# Patient Record
Sex: Male | Born: 1960
Health system: Southern US, Community
[De-identification: ages and names within clinical notes are randomized; demographics above are authoritative.]

## PROBLEM LIST (undated history)

## (undated) DIAGNOSIS — E785 Hyperlipidemia, unspecified: Secondary | ICD-10-CM

## (undated) DIAGNOSIS — G43909 Migraine, unspecified, not intractable, without status migrainosus: Secondary | ICD-10-CM

## (undated) DIAGNOSIS — I471 Supraventricular tachycardia, unspecified: Secondary | ICD-10-CM

## (undated) DIAGNOSIS — G8929 Other chronic pain: Secondary | ICD-10-CM

## (undated) DIAGNOSIS — M549 Dorsalgia, unspecified: Secondary | ICD-10-CM

## (undated) DIAGNOSIS — I2510 Atherosclerotic heart disease of native coronary artery without angina pectoris: Secondary | ICD-10-CM

## (undated) DIAGNOSIS — I10 Essential (primary) hypertension: Secondary | ICD-10-CM

## (undated) DIAGNOSIS — R7303 Prediabetes: Secondary | ICD-10-CM

## (undated) HISTORY — DX: Supraventricular tachycardia, unspecified: I47.10

## (undated) HISTORY — PX: CORONARY STENT PLACEMENT: SHX1402

## (undated) HISTORY — DX: Essential (primary) hypertension: I10

## (undated) HISTORY — DX: Prediabetes: R73.03

## (undated) HISTORY — DX: Supraventricular tachycardia: I47.1

## (undated) HISTORY — PX: LEG SURGERY: SHX1003

## (undated) HISTORY — DX: Migraine, unspecified, not intractable, without status migrainosus: G43.909

---

## 2002-05-31 ENCOUNTER — Encounter: Admission: RE | Admit: 2002-05-31 | Discharge: 2002-08-29 | Payer: Self-pay

## 2006-02-06 ENCOUNTER — Ambulatory Visit: Payer: Self-pay | Admitting: Cardiology

## 2006-02-10 ENCOUNTER — Ambulatory Visit: Payer: Self-pay | Admitting: Cardiology

## 2006-02-12 ENCOUNTER — Encounter: Payer: Self-pay | Admitting: Cardiology

## 2006-02-12 ENCOUNTER — Inpatient Hospital Stay (HOSPITAL_COMMUNITY): Admission: RE | Admit: 2006-02-12 | Discharge: 2006-02-13 | Payer: Self-pay | Admitting: Cardiovascular Disease

## 2006-02-12 ENCOUNTER — Inpatient Hospital Stay (HOSPITAL_BASED_OUTPATIENT_CLINIC_OR_DEPARTMENT_OTHER): Admission: RE | Admit: 2006-02-12 | Discharge: 2006-02-12 | Payer: Self-pay | Admitting: Cardiovascular Disease

## 2006-02-12 ENCOUNTER — Ambulatory Visit: Payer: Self-pay | Admitting: Cardiovascular Disease

## 2006-02-24 HISTORY — PX: CARDIAC CATHETERIZATION: SHX172

## 2006-03-11 ENCOUNTER — Ambulatory Visit: Payer: Self-pay | Admitting: Cardiology

## 2006-03-20 ENCOUNTER — Encounter: Payer: Self-pay | Admitting: Cardiology

## 2006-03-25 ENCOUNTER — Ambulatory Visit: Payer: Self-pay | Admitting: Cardiology

## 2006-03-25 ENCOUNTER — Observation Stay (HOSPITAL_COMMUNITY): Admission: EM | Admit: 2006-03-25 | Discharge: 2006-03-27 | Payer: Self-pay | Admitting: Emergency Medicine

## 2006-03-26 ENCOUNTER — Encounter: Payer: Self-pay | Admitting: Cardiology

## 2006-04-02 ENCOUNTER — Ambulatory Visit: Payer: Self-pay | Admitting: Gastroenterology

## 2006-04-22 ENCOUNTER — Ambulatory Visit: Payer: Self-pay | Admitting: Cardiology

## 2006-05-21 ENCOUNTER — Encounter: Payer: Self-pay | Admitting: Cardiology

## 2006-11-06 ENCOUNTER — Ambulatory Visit: Payer: Self-pay | Admitting: Cardiovascular Disease

## 2006-11-06 ENCOUNTER — Ambulatory Visit: Payer: Self-pay | Admitting: Cardiology

## 2006-11-06 LAB — CONVERTED CEMR LAB
BUN: 15 mg/dL (ref 6–23)
Basophils Absolute: 0 10*3/uL (ref 0.0–0.1)
Calcium: 9.3 mg/dL (ref 8.4–10.5)
Creatinine, Ser: 1.2 mg/dL (ref 0.4–1.5)
Eosinophils Absolute: 0.3 10*3/uL (ref 0.0–0.6)
Eosinophils Relative: 3.8 % (ref 0.0–5.0)
GFR calc Af Amer: 84 mL/min
GFR calc non Af Amer: 69 mL/min
INR: 0.9 (ref 0.8–1.0)
Lymphocytes Relative: 28.4 % (ref 12.0–46.0)
Potassium: 4.1 meq/L (ref 3.5–5.1)
RBC: 5.05 M/uL (ref 4.22–5.81)
Sodium: 141 meq/L (ref 135–145)
aPTT: 28.9 s (ref 21.7–29.8)

## 2006-11-13 ENCOUNTER — Inpatient Hospital Stay (HOSPITAL_BASED_OUTPATIENT_CLINIC_OR_DEPARTMENT_OTHER): Admission: RE | Admit: 2006-11-13 | Discharge: 2006-11-13 | Payer: Self-pay | Admitting: Cardiovascular Disease

## 2006-11-13 ENCOUNTER — Ambulatory Visit: Payer: Self-pay | Admitting: Internal Medicine

## 2009-07-19 ENCOUNTER — Telehealth: Payer: Self-pay | Admitting: Cardiology

## 2009-08-03 ENCOUNTER — Encounter: Admission: RE | Admit: 2009-08-03 | Discharge: 2009-08-03 | Payer: Self-pay | Admitting: Family Medicine

## 2009-08-21 ENCOUNTER — Encounter: Admission: RE | Admit: 2009-08-21 | Discharge: 2009-08-21 | Payer: Self-pay | Admitting: Family Medicine

## 2009-09-14 ENCOUNTER — Encounter: Admission: RE | Admit: 2009-09-14 | Discharge: 2009-09-14 | Payer: Self-pay | Admitting: Family Medicine

## 2010-03-26 NOTE — Progress Notes (Signed)
Summary: PHONE: ANTI INFLAMMATORY MEDICATIONS  Phone Note Call from Patient Call back at Home Phone [phone]   Caller: Evelina Bucy -[phone]- cell # [phone] Reason for Call: Talk to Doctor Summary of Call: Micah Flesher to see PA @ [facility] and they are wanting to put him on anti inflammatory medications due to alot of pain in back. Was told to contact Dr. Andee Lineman before taking this medication.  Initial call taken by: Zachary George,  Jul 19, 2009 3:44 PM  Follow-up for Phone Call        in the absence of a medication list and not having seen the patient in 2008, I cannot answer this question appropriately. Follow-up by: Lewayne Bunting, MD, [facility],  Jul 22, 2009 8:26 PM  Additional Follow-up for Phone Call Additional follow up Details #1::        Wife notified.   Hoover Brunette, LPN  Jul 24, 2009 3:07 PM

## 2010-03-26 NOTE — Letter (Signed)
Summary: Discharge Summary/ [facility]  Discharge [facility]   Imported By: Dorise Hiss 07/31/2009 15:31:54  _____________________________________________________________________  External Attachment:    Type:   Image     Comment:   External Document

## 2010-03-26 NOTE — Cardiovascular Report (Signed)
Summary: Cardiac Catheterization  Cardiac Catheterization   Imported By: Dorise Hiss 07/31/2009 16:52:33  _____________________________________________________________________  External Attachment:    Type:   Image     Comment:   External Document

## 2010-03-26 NOTE — Cardiovascular Report (Signed)
Summary: Cardiac Catheterization  Cardiac Catheterization   Imported By: Dorise Hiss 07/31/2009 16:57:17  _____________________________________________________________________  External Attachment:    Type:   Image     Comment:   External Document

## 2010-07-09 NOTE — Cardiovascular Report (Signed)
NAMECAMERIN, [name]              ACCOUNT NO.:  [id number]   MEDICAL RECORD NO.:  [id number]          PATIENT TYPE:  OIB   LOCATION:  1961                         FACILITY:  [facility]   PHYSICIAN:  Bevelyn Buckles. Bensimhon, MDDATE OF BIRTH:  [birth year]   DATE OF PROCEDURE:  11/13/2006  DATE OF DISCHARGE:                            CARDIAC CATHETERIZATION   PRIMARY CARE PHYSICIAN:  Dr. Donzetta Sprung   CARDIOLOGIST:  Dr. Andee Lineman   PATIENT IDENTIFICATION:  [name] is a very pleasant 50 year old male  with a history of coronary artery disease.  He is status post previous  Taxus stent placement to the mid right coronary artery as part of the  PERSEUS trial in December 2007.  He presents today for followup  catheterization as part of the trial.  He denies any recent chest pain  or exertional dyspnea.   PROCEDURES PERFORMED:  1. Selective coronary angiography.  2. Left heart catheterization.  3. Left ventriculogram.   DESCRIPTION OF PROCEDURE:  The risks and indications of the  catheterization were described.  Consent was signed and placed on the  chart.  A 4-French arterial sheath was placed in the right femoral  artery using a modified Seldinger technique.  Standard catheters  including a JL-4, a JR-4, and angled pigtail were used for the  catheterization.  All catheter exchanges made over a wire.  No apparent  complications.   Central aortic pressure was 97/53 with a mean of 74.  LV pressure was  96/1 with an EDP of 3.  There is no aortic stenosis.   Left main was angiographically normal.   LAD was a long vessel coursing to the apex.  It gave off five small  diagonals.  There was a 40% ostial stenosis followed by a 40% proximal  stenosis at the takeoff of the first diagonal.  The midportion of the  LAD had a moderate amount of calcification.   The left circumflex vessel gave off a tiny ramus branch, a moderate-  sized OM-1, a small OM-2, and two small posterolaterals.  There is a  30%  stenosis in the ostial left circumflex and a 40% stenosis in the ostium  of the first marginal.   Right coronary artery was a large dominant vessel.  It gave off a large  PDA and two posterolaterals.  There is a stent in the mid section.  This  was widely patent.  After the stent there was about a 30% focal  stenosis.   Left ventriculogram done in the RAO position showed ejection fraction  approximately 60% with no wall motion abnormalities.   ASSESSMENT:  1. Nonobstructive coronary artery disease as described above with      patent right coronary artery stent.  2. Normal left ventricular function.   PLAN/DISCUSSION:  His stent is widely patent.  We will continue  aggressive medical therapy.      Bevelyn Buckles. Bensimhon, MD  Electronically Signed     DRB/MEDQ  D:  11/13/2006  T:  11/13/2006  Job:  [id number]   cc:   Noah Charon, MD,FACC

## 2010-07-09 NOTE — Assessment & Plan Note (Signed)
[facility]                            CARDIOLOGY OFFICE NOTE   NAME:Rames, JAYVEON CONVEY                 MRN:          [id number]  DATE:11/06/2006                            DOB:          14-Dec-1960    Mr. Marschall is in today for pre-cath evaluation.  The patient is in the  [employer] trial.  He presented originally in December of  2007, and underwent placement of a Taxus stent in the right coronary  artery by Dr. Juanda Chance.  He was found to have nonobstructive disease in  the left coronary tree.  The patient then had followup with Dr. Andee Lineman,  but had recurrent chest pain with some exertion.  Following this, he  underwent repeat cardiac catheterization in January by Dr. Juanda Chance.  At  that time, there was 40-50% narrowing in the proximal LAD and mild  irregularities in the mid-LAD.  There was a 30% ostial stenosis in the  circumflex and a 40% narrowing in the first marginal.  The right  coronary artery was a moderate-sized vessel and gave rise to a conus  branch, two right ventricular branches, a posterior descending and a  posterolateral branch.  The stent in the mid right coronary artery was  widely patent with zero stenosis.  There were irregularities in the  proximal and distal right, but no significant obstruction.  The left  ventriculogram was performed in the RAO projection and revealed no areas  of hypokinesis with an ejection fraction estimated in the range of about  60%.  His cardiac pressures at that time were relatively normal.  Since  that time, the patient has done well.  He has not been having any  significant exertional angina.  He has had followup and has remained on  aspirin and Plavix.   PAST MEDICAL HISTORY:  Remarkable for gastroesophageal reflux,  hyperlipidemia and history of tobacco use, but he is no longer smoking.   CURRENT MEDICATIONS INCLUDE:  1. Plavix 75 mg daily.  2. Aspirin 325 mg daily.  3. Lipitor 40  mg daily.  4. Lopressor 25 mg b.i.d.  5. Prevacid 30 mg daily.   ALLERGIES:  The patient has no known drug allergies.   FAMILY/SOCIAL HISTORY:  As previously noted.   PHYSICAL EXAMINATION TODAY:  The patient is alert and oriented.  The weight is 199 pounds.  Blood pressure is 124/76 and the pulse is 76.  There is no jugular venous distention.  The carotid upstrokes are brisk  without bruit.  The lung fields are clear to auscultation and percussion.  The PMI is nondisplaced.  There is a normal first and second heart sound  without murmurs, rubs or gallops.  Abdomen is soft.  The extremities reveal no edema.  Pulses are intact.   There was no electrocardiogram performed today.   No chest x-ray was performed today, as well.   Preoperative laboratory studies include a hemoglobin of 15.1, hematocrit  of 44.6, the platelet count is normal at 286,000.  BUN is 15, creatinine  1.2 and potassium 4.1.   IMPRESSION:  1. Coronary artery disease with cardiac catheterization as described  with previously widely patent stent in the distribution of the      right coronary artery with the patient randomized in the Perseus      clinical trial.  2. Hypercholesterolemia, on lipid-lowering therapy.  3. Gastroesophageal reflux.  4. History of prior tobacco use.   PLAN:  The patient will undergo repeat diagnostic catheterization as  part of the randomized clinical trial.  Risks, benefits, and  alternatives have been explained to the patient and he consents to  proceed.   ADDENDUM:  The EKG is read as normal sinus rhythm and within normal  limits.     Arturo Morton. Riley Kill, MD, [facility]  Electronically Signed    TDS/MedQ  DD: 11/11/2006  DT: 11/11/2006  Job #: [id number]

## 2010-07-12 NOTE — Discharge Summary (Signed)
[name], Kyle              ACCOUNT NO.:  [id number]   MEDICAL RECORD NO.:  [id number]          PATIENT TYPE:  INP   LOCATION:  2022                         FACILITY:  [facility]   PHYSICIAN:  Kyle [name], ACNP  DATE OF BIRTH:  Oct 14, 1960   DATE OF ADMISSION:  03/25/2006  DATE OF DISCHARGE:  03/27/2006                               DISCHARGE SUMMARY   DISCHARGING PHYSICIAN:  Kyle [name].   PRIMARY CARDIOLOGIST:  Kyle [name].   PRIMARY CARE PHYSICIAN:  Kyle [name] in [place].   DISCHARGING DIAGNOSIS:  1. Chest pain of uncertain etiology, status post cardiac      catheterization this admission showing continued patency of      previous Taxus drug-eluting stent to the mid-RCA that was placed in      December 2007, otherwise nonobstructive CAD and normal left      ventricular function.  2. Gastroesophageal reflux disease.  3. Hyperlipidemia.  4. History of tobacco use; patient is currently not smoking.   CONSULTATIONS THIS ADMISSION:  Include Kyle [name] with GI for noncardiac  chest discomfort; however, symptoms felt to be atypical for GERD.  They  recommended continued daily Protonix or any PPI.  Follow up with Dr.  Christella [name] February 19.   PAST MEDICAL HISTORY:  Includes coronary artery disease, status post  cardiac catheterization December 2007 with right coronary artery stent  placement.   HOSPITAL COURSE:  Kyle [name] is a 50 year old Caucasian gentleman with  a history of coronary artery disease as stated above.  He saw Kyle [name]  in his office on January 16 of this year with some complaints of chest  discomfort that occurs on exertion with accelerated walking.  Patient  apparently is very active and either walks or jogs daily.  On the  morning of admission, patient experienced the chest tightness again with  exertion, apparently was relieved with burping, however, and then it  returned.  Patient presented to the emergency room for further  evaluation.   Apparently when he saw Kyle [name] in January, Kyle [name]  had placed the patient on Imdur.  Patient, however, states he is not  sure he has been taking it as his wife fills out his medicine tray for  him.  Patient was admitted, scheduled for cardiac catheterization for  reevaluation of his stent.  Patient taken to the cath lab on January 31,  results as stated above.  Patient tolerated cath without problems.  GI  was asked to consult.  Kyle [name] and Kyle [name] saw patient in  consultation with arrangements to see patient in office in an outpatient  setting.  At time of discharge, patient's hemoglobin and hematocrit  stable at 14.3 and 41.4.  Blood pressure 83/51, low grade temp 99, _____  stable.  Kyle [name] recommended continuing aspirin, Plavix and Statin.  Patient's discharge instruction sheet was filled out by Kyle [name]  with instructions to continue a low sodium heart healthy diet.  Wound  care activity per supplemental sheet.  Followup appointment with Dr.  Christella [name] February 19, Kyle [name] February 28 at 12:15 and Dr.  Julien [name],  primary care physician, patient to call for appointment.   MEDICATIONS AT TIME OF DISCHARGE:  Include Adalat 400 mg.  GI has asked  patient to stop using this daily, to use it p.r.n.  Aspirin 325, Lipitor  40, metoprolol 25 mg b.i.d., Plavix 75 daily and Imdur.  There is no  dose listed for that.  Patient had previously received a prescription  from Kyle [name] and has medication at home.  Nitroglycerin as needed,  Protonix 40 mg once a day for stomach and/or equivalent.  Can use over-  the-counter Prilosec, take 20 minutes before meals or three hours after  meals per Kyle Moccasin, PA, GI.  In further evaluation, Kyle [name]  note from January does not list the dose on the Imdur that he prescribed  patient.  Patient to follow up with Kyle [name] concerning Imdur use and  whether or not he needs to continue this medication.   DURATION OF DISCHARGE  ENCOUNTER:  25 minutes.      Kyle [name], ACNP     MB/MEDQ  D:  05/16/2006  T:  05/17/2006  Job:  [id number]   cc:   Kyle [name]  Kyle [name]

## 2010-07-12 NOTE — H&P (Signed)
Kyle [name], Kyle [name] NO.:  [id number]   MEDICAL RECORD NO.:  [id number]          PATIENT TYPE:  INP   LOCATION:  2022                         FACILITY:  [facility]   PHYSICIAN:  Luis Abed, MD, FACCDATE OF BIRTH:  Nov 11, 1960   DATE OF ADMISSION:  03/25/2006  DATE OF DISCHARGE:                              HISTORY & PHYSICAL   PRIMARY CARDIOLOGIST:  Dr. Lewayne Bunting   PRIMARY CARE PHYSICIAN:  Thersa Salt in [place]   HISTORY OF PRESENT ILLNESS:  This is a 50 year old Caucasian male  patient well known to Dr. Andee Lineman with recent admission in December of  2007 secondary to chest pain.  The patient subsequently had a cardiac  catheterization revealing a 99% right coronary artery stenosis.  A Taxus  stent was placed in December of 2007 per Dr. Tonny Bollman secondary to  this stenosis.  The patient also was found to have nonobstructive  coronary artery disease in the left coronary tree.   The patient did follow up with Dr. Andee Lineman and was seen in his office on  March 11, 2006.  The patient had been having some complaints of chest  discomfort which occurs on exertion and accelerated walking.  He also  reported pain in both lower extremities on exertion.  The patient jogged  yesterday approximately one lap around a football field and during that  time he developed sudden substernal sharp chest pain which he described  as severe.  The patient stopped running and walked an additional lap and  the pain went away.  The following morning this a.m. the patient felt  some chest tightness although less intense which he described as a dull  pain which comes and goes and relieved with burping, but then returns.  He has also noted increased tightness throughout the day while at work.  He feels a gas buildup, he burps and then it goes away and it returns  with increased pressure in his chest and burping again.  Secondary to  these symptoms, the patient came to the emergency room  for further  evaluation.   When being seen by Dr. Andee Lineman on March 11, 2006 and describing his  symptoms of chest discomfort with some exertion, Dr. Andee Lineman did place  him on Imdur at an unknown dose.  The patient is not certain whether he  has taken it, as his wife fills his medicine tray and fills his  prescriptions.  It is uncertain if he has had Imdur onboard at this  time.   PAST MEDICAL HISTORY:  1. Coronary artery disease status post cardiac catheterization      December of 2007 with right coronary artery stenosis 99%.  2. Patient had percutaneous coronary intervention and a Taxus stent to      the right coronary artery in December of 2007.  3. GERD.  4. Hyperlipidemia.   SOCIAL HISTORY:  The patient lives in [place] with his wife.  He is a  Pensions consultant for [employer].  He has a 15-pack-year history, but stopped  seven to eight years ago, negative for ETOH or drug use.  He  does walk  and run almost daily.   FAMILY HISTORY:  Epilepsy, AAA repair, father with coronary artery  disease, PCI and carotid artery disease, he has a son with bicuspid  aortic valve.   CURRENT MEDICATIONS AT HOME:  1. Elodolac 400 mg once a day.  2. Aspirin 325 once a day.  3. Lipitor 40 mg once day.  4. Metoprolol 25 mg b.i.d.  5. Plavix 75 mg once a day.  6. Imdur at unknown dose.   ALLERGIES:  No known drug allergies.   LABS:  Point-of-care markers:  Troponin 0.05, myoglobin 82.0, CK-MB 1.5,  hemoglobin 45, hematocrit 15.3, sodium 139, potassium 3.8, chloride 108,  CO2 unknown, BUN 13, creatinine 0.9, glucose 86, PT 13.9, INR 1.1, PTT  29.   EKG revealing normal sinus rhythm with a ventricular rate of 64 beats  per minute without evidence of ischemia.   PHYSICAL EXAMINATION:  VITAL SIGNS:  Blood pressure 110/73, pulse 70,  respirations 20, O2 sat 97.9.  HEENT:  Head is normocephalic, atraumatic.  Eyes:  PERRLA.  Mucous  membranes and mouth pink and moist.  Tongue is midline.  NECK:   Supple.  There is no JVD.  No carotid bruits appreciated.  No  thyromegaly noted.  CARDIOVASCULAR:  Regular rate and rhythm without murmurs, rubs or  gallops.  LUNGS:  Clear to auscultation.  ABDOMEN:  Soft, nontender, 2+ bowel sounds.  EXTREMITIES:  Without clubbing, cyanosis or edema.  Dorsalis pedis  pulses 1+ bilaterally.  SKIN:  Warm and dry.  NEURO:  Intact.   IMPRESSION:  1. Chest pain in patient with known history of CAD and stent to the      right coronary artery in December of 2007.  2. Frequent burping symptoms post PCI.  3. Hypercholesterolemia.   PLAN:  This patient has been seen and examined by myself and Dr. Willa Rough in the emergency room, he has recurrent symptoms and has been seen  by Dr. Andee Lineman with addition of Imdur since PCI.  Plan will be to admit  the patient and we will schedule cardiac catheterization in the a.m. of  March 26, 2006.  If the cardiac catheterization  reveals patent stent and no additional evidence of coronary artery  disease, our plan will be to evaluate the patient through a GI workup.  This will be considered after catheterization.  This has been explained  to the patient including risks and benefits and the patient is willing  to proceed.      Bettey Mare. Lyman Bishop, NP      Luis Abed, MD, [facility]  Electronically Signed    KML/MEDQ  D:  03/25/2006  T:  03/26/2006  Job:  [id number]   cc:   Aurther Loft MD Julien Girt
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
## 2010-07-12 NOTE — Assessment & Plan Note (Signed)
[facility]                          [facility] OFFICE NOTE   NAME:Luna, DREY SHAFF                 MRN:          [id number]  DATE:04/22/2006                            DOB:          [birth year]    PRIMARY CARDIOLOGIST:  Dr. Lewayne Bunting.   REASON FOR VISIT:  Post-hospital followup.   Mr. Yaffe returns to the clinic following recent overnight stay at  [facility] for evaluation of recurrent chest pain.  He  underwent repeat coronary angiography after having undergone initial  percutaneous intervention of a very high-grade, focal mid RCA lesion  with placement of a TAXUS stent in mid December.  Repeat cardiac  catheterization by Dr. Charlies Constable this admission revealed a widely  patent RCA stent site with otherwise residual nonobstructive CAD and  normal left ventricular function.   The patient's symptoms were felt to be non-cardiac in etiology, per Dr.  Charlies Constable, and the patient was discharged on Protonix.  He now  presents in followup, reporting some amelioration of his symptoms since  taking Protonix.  He denies any exertional angina pectoris.   CURRENT MEDICATIONS:  1. Plavix.  2. Full-dose aspirin.  3. Lipitor 40 daily.  4. Metoprolol 25 b.i.d.  5. Protonix.  6. Etodolac 400 daily.   PHYSICAL EXAM:  Blood pressure 108/69, pulse 60 and regular, weight 205.  GENERAL:  A 50 year old male in no apparent distress.  HEENT:  Normocephalic, atraumatic.  NECK:  Palpable bilateral carotid pulses without bruits.  LUNGS:  Clear to auscultation in all fields.  HEART:  Regular rate and rhythm (S1, S2).  No significant murmurs.  No  rubs or gallops.  ABDOMEN:  Soft and nontender.  EXTREMITIES:  Right groin stable with no ecchymosis, hematoma, or bruit  on auscultation.  Palpable femoral and distal pulse.  NEURO:  Mildly anxious, but no focal deficits.   IMPRESSION:  1. Atypical chest pain.  2. Severe single-vessel coronary  artery disease.      a.     Status post TAXUS stenting subtotal mid right coronary       artery December 2007:  Widely patent by recent re-look coronary       angiogram.      b.     Residual nonobstructive coronary artery disease.      c.     Normal left ventricular function.      d.     Enrolled in PERSEUS:  Recommended re-look cardiac       catheterization in 9 months.  3. History of claudication.      a.     Recent normal lower extremity ankle brachial index.  4. Remote tobacco.  5. Dyslipidemia.      a.     Hypertriglyceridemia, low HDL.   PLAN:  1. Continue current medication regimen, including Plavix for      recommended duration of 2 years.  2. Check a followup fasting lipid/liver profile in 1 month.  My      recommendation at this point in time is to consider substituting      Lipitor with Crestor given his particular  lipid profile.  3. Return clinic followup with Dr. Andee Lineman in 6 months.      Gene Serpe, PA-C  Electronically Signed      Learta Codding, MD,FACC  Electronically Signed   GS/MedQ  DD: 04/22/2006  DT: 04/22/2006  Job #: [id number]   cc:   Donzetta Sprung

## 2010-07-16 NOTE — Discharge Summary (Signed)
Kyle [name], Kyle [name]              ACCOUNT NO.:  [id number]   MEDICAL RECORD NO.:  [id number]          PATIENT TYPE:  INP   LOCATION:  6532                         FACILITY:  [facility]   PHYSICIAN:  Veverly Fells. Excell Seltzer, MD  DATE OF BIRTH:  1960-04-24   DATE OF ADMISSION:  02/12/2006  DATE OF DISCHARGE:  02/13/2006                               DISCHARGE SUMMARY   PRIMARY CARDIOLOGIST:  Dr. Lewayne Bunting.   PRIMARY CARE PHYSICIAN:  Dr. Donzetta Sprung in [place].   PRINCIPAL DIAGNOSIS:  Unstable angina.   SECONDARY DIAGNOSES:  1. Coronary artery disease.  2. Gastroesophageal reflux disease.  3. History of tonsillectomy.  4. Remote tobacco abuse.  5. History of right ankle surgery.  6. Hyperlipidemia.   ALLERGIES:  NO KNOWN DRUG ALLERGIES.   PROCEDURES:  Left heart cardiac catheterization with PCI and stenting of  the right coronary artery with a Perseus study stent (Taxus versus Taxus  Element drug-eluting stent).   HISTORY OF PRESENT ILLNESS:  A 50 year old male with no prior history of  coronary artery disease and remote history of tobacco abuse who recently  saw Dr. Andee Lineman in the office on February 10, 2006 secondary to a 35-month  history of exertional chest discomfort with subsequent abnormal stress  Myoview revealing moderate inferior ischemia.  Decision was made to  pursue left heart cardiac catheterization at that time.   HOSPITAL COURSE:  The patient presented to [facility] February 12, 2006  for left heart cardiac catheterization which was performed by Dr.  Tonny Bollman revealing nonobstructive coronary disease in the left  coronary tree with a 99% stenosis in the mid RCA.  EF was 65%.  The RCA  was then approached percutaneously, it was successful, and the patient  was enrolled in the Perseus study and underwent successful placement of  a Perseus study stent (Taxus versus Taxus Element drug-eluting stent).  The patient tolerated this procedure well and postprocedure has not  had  any chest discomfort.  He has been monitored in the step-down area and  has been ambulating without difficulty.  He is being discharged home  today in satisfactory condition.   DISCHARGE LABORATORIES:  Hemoglobin 15.3, hematocrit 44.6, WBC 10.4,  platelets 284.  Sodium 137, potassium 3.9, chloride 103, CO2 26, BUN 15,  creatinine __________, glucose 99, CK-MB 1.8, CK 153.   DISPOSITION:  The patient is being discharged home today in good  condition.   FOLLOW-UP PLANS AND APPOINTMENTS:  Mr. Lazalde has a follow-up  appointment with Dr. Andee Lineman on March 11, 2006 at 2:15 p.m.  He is  asked to follow up with Dr. Donzetta Sprung as previously scheduled.  As  part of Perseus study protocol, the patient will require a re-look  cardiac catheterization 9 months postprocedure.   DISCHARGE MEDICATIONS:  1. Aspirin 325 mg daily.  2. Plavix 75 mg daily.  3. Lipitor 40 mg daily.  4. Lopressor 25 mg b.i.d.  5. Nitroglycerin 0.4 mg sublingual p.r.n. chest pain.   OUTSTANDING LABORATORY STUDIES:  None.   DURATION DISCHARGE ENCOUNTER:  Forty minutes including physician time.  Kyle [name], Kyle [name]      Veverly Fells. Excell Seltzer, MD  Electronically Signed    CB/MEDQ  D:  02/13/2006  T:  02/13/2006  Job:  [id number]   cc:   Donzetta Sprung

## 2010-07-16 NOTE — Assessment & Plan Note (Signed)
[facility]                          [facility]ICE NOTE   NAME:Kyle [name], Kyle [name]                       MRN:          [id number]  DATE:02/10/2006                            DOB:          [birth year]    HISTORY OF PRESENT ILLNESS:  The patient is a 50 year old male with no  prior history of coronary artery disease.  The patient has a prior  history of tobacco use but quit approximately 10 years ago.  He used to  smoke a half pack to one pack a day.  Per his report, his cholesterol  has also been within normal limits, although I do not have the data  available.  The patient does have a family history of coronary artery  disease.  His father recently underwent PCI and is seen in our clinic.  He also has carotid disease.  The patient has a son with congenital  disease which includes bicuspid aortic valve.   The patient reports over the last 6 months a consistent history of  exertional chest pain.  He states that on occasion he jogs from his home  to his parents house.  In the time frame, he has noticed that he  develops a chest tightness which he usually rates a 4/10.  This is  associated with increased shortness of breath and some diaphoresis.  He  has noticed that stopping and resting fairly quickly resolves his chest  pressure.  He also has noticed that during heavy exertional physical  activity at the job, he develops a similar type of pain.  The patient  discussed these symptoms with Dr. Reuel Boom and a cardiology stress study  was ordered.  The study demonstrated that the patient had good exercise  tolerance but did report 2/10 chest pain at stage III of the Bruce  protocol.  This was relieved within 1 minute.  He did achieve a work  load of 12.4 mets.  The Cardiolite images demonstrate an ejection  fraction of 64% with a moderate inferior defect which was reversible  consistent with ischemia.   The patient now presents to the office to further  discuss the results of  his test.  He states that he has been very anxious after he found out  about this study.  He felt a pounding in his chest and has noticed some  palpitations in the evening.  He also reports he had left sided chest  pain which is distinctly different from his exertional chest tightness  as reported above.  His EKG, today in the office, does not show any  evidence of ischemia with no acute ischemic changes.   PAST MEDICAL HISTORY:  1. History of GERD.  2. Tonsillectomy.  3. Right ankle surgery.   SOCIAL HISTORY:  The patient works as Pensions consultant.  He used to smoke.  He  lives with his wife in [place].   FAMILY HISTORY:  Notable for a father who underwent percutaneous  coronary intervention, also has carotid disease, and has a son with  bicuspid aortic valve.  His son receives his medical care at [facility].  MEDICATIONS:  Etodolac 400 mg a day.   REVIEW OF SYSTEMS:  As per HPI.  The patient reports insomnia and  fatigue as well as weakness.  He also reports palpitations and  exertional chest pain as well as dyspnea as outlined above.  He reports  joint stiffness but no dysuria or frequency and no current heartburn.  No pruritus or hives.  No tremor.  According to his wife, this patient  has been different the last month.  No bruising and no melena,  hematochezia, no dysuria or frequency.  The remainder of the review of  systems is negative.   PHYSICAL EXAMINATION:  VITAL SIGNS:  Blood pressure 116/78, heart rate  is 85 beats per minute.  GENERAL:  A well nourished white male in no apparent distress.  HEENT:  Pupils equal and reactive.  Conjunctivae clear.  NECK:  Supple.  Normal carotid upstrokes.  No carotid bruits.  LUNGS:  Clear breath sounds bilaterally.  HEART:  Regular rate and rhythm.  Normal S1 S2.  No murmurs, rubs, or  gallops.  ABDOMEN:  Soft and nontender.  No rebound or guarding.  Good bowel  sounds.  EXTREMITIES:  Reveals no cyanosis,  clubbing, or edema.  NEUROLOGIC:  The patient is alert and oriented and grossly nonfocal.   A 12-lead electrocardiogram was described above.   PROBLEM LIST:  1. Exertional chest pain consistent with angina.  2. Abnormal Cardiolite study with inferior ischemia.  3. History of tobacco use previously.  4. Family history of abdominal aneurysm in grandmother.  5. Family history of coronary disease in father.   PLAN:  1. The patient's symptoms are very consistent with exertional angina.      He also has an abnormal Cardiolite stress study.  I have given the      patient the option to proceed with cardiac catheterization.  After      a long discussion with he and his wife, they feel comfortable to      proceed with the procedure in the [facility].  2. In the interim, I have placed the patient on aspirin and beta-      blocker therapy and p.r.n. nitroglycerin.  I told the patient that      he has substernal chest pain in similar quality to his exertional      pain at rest, he will need to present himself to the emergency room      if not resolved after one nitroglycerin.  3. The patient has been scheduled for a [facility] catheterization later this      week.     Learta Codding, MD,FACC  Electronically Signed    GED/MedQ  DD: 02/10/2006  DT: 02/10/2006  Job #: [id number]   cc:   Donzetta Sprung

## 2010-07-16 NOTE — Cardiovascular Report (Signed)
NAMEARDIAN, HABER[place]              ACCOUNT NO.:  [id number]   MEDICAL RECORD NO.:  [id number]          PATIENT TYPE:  OIB   LOCATION:  1961                         FACILITY:  [facility]   PHYSICIAN:  Veverly Fells. Excell Seltzer, MD  DATE OF BIRTH:  April 20, 1960   DATE OF PROCEDURE:  02/12/2006  DATE OF DISCHARGE:  02/12/2006                            CARDIAC CATHETERIZATION   PROCEDURE:  Left heart catheterization, selective coronary angiography,  left ventricular angiography.   INDICATIONS:  Mr. Isabell is a very pleasant 50 year old gentleman with  classic exertional angina over the last six months.  He underwent an  exercise Myoview stress study that demonstrated a moderate-sized  inferior wall defect which was reversible.  Today, he reports that since  his stress study, he has had increasing symptoms and occasionally has  had chest pain at rest.  He was referred for cardiac catheterization  following his abnormal stress test.   PROCEDURAL DETAILS:  Risks and indications of the procedure were  explained in detail to the patient.  Informed consent was obtained.  The  right groin was prepped, draped and anesthetized with 1% lidocaine.  A 4-  French sheath was placed in the right femoral artery using the modified  Seldinger technique.  Multiple views of the left and right coronary  arteries were taken.  For the left coronary artery, a 4-French JL-4  catheter was used for the right coronary artery a 4-French JR-4 catheter  was used.  Following selective coronary angiography, a 4-French angled  pigtail catheter was inserted into the left ventricle and ventricular  pressures were recorded.  A 30 degrees right anterior oblique left  ventriculogram was performed.  A pullback across the aortic valve was  done.  At the conclusion of the case, the arterial sheath was left in  place in anticipation of PCI later today.   FINDINGS:  Aortic pressure 101/63 with a mean of 82, left ventricular  pressure 114/0  with an end-diastolic pressure of 5.   CORONARY ANGIOGRAPHY:  The left mainstem is mildly calcified.  There is  no significant obstructive disease.  The left main stem bifurcates into  the LAD and left circumflex.   The LAD has nonobstructive disease throughout its proximal and  midportion.  The most significant area was the takeoff of the first  diagonal and there appears to be an approximate 40% stenosis through  that region.  The remainder of the mid and distal LAD have no  significant angiographic disease.  There were several small diagonals  arising from the mid and distal LAD.   Left circumflex system is medium caliber.  It gives off a medium-sized  first obtuse marginal branch that bifurcates into twin vessels.  There  is nonobstructive plaque in the proximal portion of that vessel.  There  is no other significant angiographic disease throughout the left  circumflex system.   Right coronary artery is dominant.  It is a large-caliber vessel.  There  is 20% proximal stenosis and in the midportion of the right coronary  artery.  There is a 95-99% very high-grade stenosis that is a focal  lesion.  There is TIMI III flow in the vessel.  The remainder of the  distal site remainder of the distal right coronary artery is free of any  significant angiographic disease.  The right coronary artery bifurcates  into the PDA and posterior AV segment which gives off two posterolateral  branch.  The high-grade lesion described above is also somewhat hazy in  appearance.  The branch vessels of the right coronary artery had no  significant disease.   Left ventricular function is normal as assessed by left ventriculogram.  The LVEDF is 65%.   ASSESSMENT:  1. Severe single-vessel coronary artery disease.  2. Nonobstructive left anterior descending artery in the left      circumflex disease.  3. Normal left ventricular function.   PLAN:  I reviewed the findings in detail with the patient.  In  the  setting of his increasing symptoms and intermittent chest pain, I  elected to admit him to the hospital perform PCI today.  He was given  600 mg of Plavix.  We will plan on using bivalirudin for  anticoagulation.      Veverly Fells. Excell Seltzer, MD  Electronically Signed     MDC/MEDQ  D:  02/12/2006  T:  02/13/2006  Job:  [id number]   cc:   Learta Codding, MD,FACC

## 2010-07-16 NOTE — Cardiovascular Report (Signed)
Kyle [name], Kyle [name]              ACCOUNT NO.:  [id number]   MEDICAL RECORD NO.:  [id number]          PATIENT TYPE:  INP   LOCATION:  2022                         FACILITY:  [facility]   PHYSICIAN:  Bruce R. Juanda Chance, MD, FACCDATE OF BIRTH:  06-28-1960   DATE OF PROCEDURE:  03/26/2006  DATE OF DISCHARGE:                            CARDIAC CATHETERIZATION   CLINICAL HISTORY:  Mr. [name] is 50 years old and one month ago had a  Taxus stent placed in the mid right coronary artery as part of the  Perseus trial.  He developed recurrent chest pain today while jogging.  He saw Dr. Andee Lineman and Dr. Andee Lineman arranged for him to come down to [facility] for further evaluation with angiography.   PROCEDURE:  The procedure was followed by the right femoral using  arterial sheath and 6 French pre-formed coronary catheters.  A front  wall anterior puncture was performed and Omnipaque contrast was used.  The right femoral artery was closed with Angio-Seal at the end of the  procedure.  The patient tolerated the procedure well and left the  laboratory in satisfactory condition.   RESULTS:  Left main coronary was free of significant disease.   Left anterior descending artery gave rise to four diagonal branches and  two septal perforators.  There was 40-50% narrowing in the proximal LAD  and mild irregularities in the mid LAD.   The circumflex artery gave rise to two marginal branches and two small  posterolateral branches.  There was 30% ostial stenosis in the  circumflex artery.  There was 40% narrowing in the first marginal  branch.   The right coronary was a moderate sized vessel and gave rise to a conus  branch, two right ventricle branches, a posterior descending branch, and  a posterolateral branch.  The stent in the mid right coronary was widely  patent with 0% stenosis.  There are irregularities in the proximal and  distal right coronary but no significant obstruction.   The left  ventriculogram was performed in the RAO projection and showed  good wall motion with no areas of hypokinesis.  The estimated fraction  was 60%.   The aortic pressure was 104/62 with a mean of 82.  The left ventricular  pressure is 104/5.   CONCLUSION:  Coronary artery status post recent stenting of the right  coronary with 0% stenosis at the stent site in the mid right coronary,  40-50% narrowing in the proximal LAD, 30% ostial narrowing in the  circumflex artery, and normal LV function.   RECOMMENDATIONS:  In view of these findings, I do not think the  patient's recent symptoms were cardiac related.  Will plan reassurance.  Will keep the patient tonight and plan discharge in the morning.      Bruce Elvera Lennox Juanda Chance, MD, [facility]  Electronically Signed     BRB/MEDQ  D:  03/26/2006  T:  03/26/2006  Job:  [id number]   cc:   Learta Codding, MD,FACC  Donzetta Sprung

## 2010-07-16 NOTE — Assessment & Plan Note (Signed)
[facility]                          [facility] OFFICE NOTE   NAME:Kyle [name], Kyle [name]                       MRN:          [id number]  DATE:03/11/2006                            DOB:          [birth year]    HISTORY OF PRESENT ILLNESS:  The patient is a 50 year old male recently  diagnosed with significant single vessel coronary artery disease. The  patient had a coronary intervention by Dr. Excell Seltzer. The patient had a  TAXUS stent placed at the right coronary artery. The patient had normal  LV function. He has done well. He reports no complication related to the  right groin. He does report still occasional chest pain upon exertion  and accelerated walking. He also reports pain in both lower extremities  upon exertion. He denies any orthopnea, PND. Denies any palpitations or  syncope. The patient states that after several 100 feet he does report  some bilateral calf pain, which is relieved with rest. In the interim,  he has been compliant with medical regimen. He does not have p.r.n.  nitroglycerine anymore.   MEDICATIONS:  1. Etodolac 400 mg p.o. daily.  2. Aspirin 325 daily.  3. Lipitor 40 mg p.o. daily.  4. Metoprolol 25 b.i.d.  5. Plavix 75 mg p.o. daily.   PHYSICAL EXAMINATION:  VITAL SIGNS: Blood pressure is 120/80, heart rate  is 76, weight is 211.  NECK: Normal carotid upstroke. No carotid bruits.  LUNGS:  Clear breath sounds bilaterally.  HEART: Regular rate and rhythm. Normal S1, S2. No murmur, rubs or  gallops.  ABDOMEN: Soft.  EXTREMITIES: No edema.   PROBLEM LIST:  1. Coronary artery disease, status post percutaneous coronary      intervention (PCI) and TAXUS stent to the right coronary artery.  2. Normal left ventricular function.  3. History of tobacco use previously.  4. Familuy history AAA  5. History of left coronary artery disease in father.  6. Possible claudication.   PLAN:  1. The patient will need to continue on his  anti-anginal medications.      He still has some substernal chest pain. I am not sure if this      represents a small vessel disease.  2. The patient received Imdur as additional therapy.  3. The patient possibly has claudication and we have ordered ABIs      bilaterally.  4. The patient will have laboratory work done by Dr. Reuel Boom and his      lipid panel will be followed.  5. The patient will need to stay at least for 1 year on aspirin and      Plavix in combination therapy.     Learta Codding, MD,FACC  Electronically Signed    GED/MedQ  DD: 03/11/2006  DT: 03/11/2006  Job #: [id number]   cc:   Donzetta Sprung

## 2010-07-16 NOTE — Cardiovascular Report (Signed)
Kyle [name], Kyle [name]              ACCOUNT NO.:  [id number]   MEDICAL RECORD NO.:  [id number]          PATIENT TYPE:  INP   LOCATION:  6532                         FACILITY:  [facility]   PHYSICIAN:  Veverly Fells. Excell Seltzer, MD  DATE OF BIRTH:  03-02-1960   DATE OF PROCEDURE:  02/12/2006  DATE OF DISCHARGE:                            CARDIAC CATHETERIZATION   PROCEDURE:  PTCA and stenting of the right coronary artery.   PROCEDURAL INDICATIONS:  Kyle [name] is a 50 year old gentleman who I  performed a diagnostic cardiac catheterization on this morning in the [facility]  lab.  He presented with increasing angina and was found to have a 99%  mid right coronary artery lesion.  He has developed some resting  symptoms in the last few days, so I elected to admit him and perform PCI  on the same day as his diagnostic procedure.   PROCEDURAL DETAILS:  Risks and indications of procedure were explained  in detail to the patient and his family.  Informed consent was obtained.  There was a 4-French sheath present in the right femoral artery from the  diagnostic procedure.  This was exchanged out for a 6-French sheath  using normal technique.  One percent lidocaine was used prior to the  sheath exchange.  Angiomax was given for anticoagulation.  A 6-French JR-  4 coronary guiding catheter was inserted.  Once therapeutic ACT was  achieved, a Cougar guidewire was inserted beyond the lesion into the  distal right coronary artery.  Lesion was predilated with a 2.5 x 12-mm  Sprinter balloon.  The lesion was fairly noncompliant, and the lesion  did not open until we reached 14 atmospheres pressure which is the burst  pressure for that balloon.  Following balloon dilatation, there was  improvement in the stenosis, and there remained TIMI 3 flow in the  vessel.  Angiograms were taken, and the vessel appeared to be  approximately 3.5-mm diameter.  The patient was then randomized in the  Perseus study.  He received a 3.5 x  16-mm Taxus Element Stent which was  deployed at 14 atmospheres pressure.  There was excellent stent  expansion and TIMI 3 flow following stent deployment.  The stent was  post dilated with 3.75 x 15-mm Quantum Maverick balloon up to 16  atmospheres pressure.  Intracoronary nitroglycerin was taken, and final  angiograms were performed in orthogonal views demonstrating widely a  patent stented TIMI 3 flow with excellent stent expansion.   CONCLUSION:  Successful percutaneous transluminal coronary angioplasty  and stenting of the mid-right coronary artery.   RECOMMENDATIONS:  Recommend life-long aspirin.  The patient should  receive a minimum of 12 months of clopidogrel.      Veverly Fells. Excell Seltzer, MD  Electronically Signed     MDC/MEDQ  D:  02/12/2006  T:  02/13/2006  Job:  [id number]   cc:   Learta Codding, MD,FACC  Donzetta Sprung

## 2011-02-24 ENCOUNTER — Encounter: Payer: Self-pay | Admitting: *Deleted

## 2011-02-24 ENCOUNTER — Emergency Department (HOSPITAL_COMMUNITY)
Admission: EM | Admit: 2011-02-24 | Discharge: 2011-02-24 | Disposition: A | Discharge status: Home / Self Care | Payer: BC Managed Care – PPO | Attending: Emergency Medicine | Admitting: Emergency Medicine

## 2011-02-24 ENCOUNTER — Telehealth: Payer: Self-pay | Admitting: Cardiology

## 2011-02-24 ENCOUNTER — Other Ambulatory Visit: Payer: Self-pay

## 2011-02-24 DIAGNOSIS — I2510 Atherosclerotic heart disease of native coronary artery without angina pectoris: Secondary | ICD-10-CM | POA: Insufficient documentation

## 2011-02-24 DIAGNOSIS — R002 Palpitations: Secondary | ICD-10-CM | POA: Insufficient documentation

## 2011-02-24 DIAGNOSIS — Z87891 Personal history of nicotine dependence: Secondary | ICD-10-CM | POA: Insufficient documentation

## 2011-02-24 DIAGNOSIS — E785 Hyperlipidemia, unspecified: Secondary | ICD-10-CM | POA: Insufficient documentation

## 2011-02-24 DIAGNOSIS — M549 Dorsalgia, unspecified: Secondary | ICD-10-CM | POA: Insufficient documentation

## 2011-02-24 DIAGNOSIS — I471 Supraventricular tachycardia, unspecified: Secondary | ICD-10-CM

## 2011-02-24 DIAGNOSIS — R42 Dizziness and giddiness: Secondary | ICD-10-CM | POA: Insufficient documentation

## 2011-02-24 DIAGNOSIS — Z7982 Long term (current) use of aspirin: Secondary | ICD-10-CM | POA: Insufficient documentation

## 2011-02-24 DIAGNOSIS — G8929 Other chronic pain: Secondary | ICD-10-CM | POA: Insufficient documentation

## 2011-02-24 HISTORY — DX: Hyperlipidemia, unspecified: E78.5

## 2011-02-24 HISTORY — DX: Dorsalgia, unspecified: M54.9

## 2011-02-24 HISTORY — DX: Other chronic pain: G89.29

## 2011-02-24 HISTORY — DX: Atherosclerotic heart disease of native coronary artery without angina pectoris: I25.10

## 2011-02-24 LAB — BASIC METABOLIC PANEL
BUN: 12 mg/dL (ref 6–23)
Chloride: 107 mEq/L (ref 96–112)
GFR calc non Af Amer: >90 mL/min (ref >90)
Glucose, Bld: 103 mg/dL — ABNORMAL HIGH (ref 70–99)
Potassium: 4.6 mEq/L (ref 3.5–5.1)
Sodium: 141 mEq/L (ref 135–145)

## 2011-02-24 LAB — BASIC METABOLIC PANEL WITH GFR
CO2: 27 meq/L (ref 19–32)
Calcium: 9.7 mg/dL (ref 8.4–10.5)
Creatinine, Ser: 0.91 mg/dL (ref 0.50–1.35)
GFR calc Af Amer: >90 mL/min (ref >90)

## 2011-02-24 LAB — CBC
HCT: 48.5 % (ref 39.0–52.0)
Hemoglobin: 16 g/dL (ref 13.0–17.0)
MCH: 29.4 pg (ref 26.0–34.0)
MCHC: 33 g/dL (ref 30.0–36.0)
MCV: 89 fL (ref 78.0–100.0)
Platelets: 225 10*3/uL (ref 150–400)
RBC: 5.45 MIL/uL (ref 4.22–5.81)
RDW: 13.2 % (ref 11.5–15.5)
WBC: 7.8 K/uL (ref 4.0–10.5)

## 2011-02-24 LAB — TROPONIN I
Troponin I: <0.3 ng/mL (ref <0.30)
Troponin I: 0.4 ng/mL (ref <0.30)

## 2011-02-24 MED ORDER — DILTIAZEM HCL 60 MG PO TABS: 60.0000 mg | ORAL_TABLET | Freq: Four times a day (QID) | ORAL | Status: DC

## 2011-02-24 NOTE — ED Notes (Signed)
Dr Oletta Lamas notified that troponin .40

## 2011-02-24 NOTE — ED Notes (Signed)
HR of 98. NSR. NAD. Continues to deny any symptoms. Family at bedside.

## 2011-02-24 NOTE — Telephone Encounter (Signed)
Transported to emergency department on 02/24/11 with chest pain and dyspnea and supraventricular tachycardia at 180 bpm.  Adenosine converted to sinus rhythm.  Diltiazem, total of 240 mg per day, initiated with plans for followup by Dr. Andee Lineman.

## 2011-02-24 NOTE — ED Notes (Signed)
Pt denies pain or symptoms on arrival to ED after conversion of SVT.

## 2011-02-24 NOTE — ED Notes (Signed)
Pt states he was hammering a nail and became dizzy, palpitations, sweating, and Chest pain. Pt took one NTG without relief. EMS found HR  Of 207. Adenosine 6mg  IV administered by EMS and pt converted with rate of 102 immediately. Pt awake and alert upon arrival. Pt states this occurred ~1130.Sinus tach with rate of 102 on arrival.

## 2011-02-24 NOTE — ED Provider Notes (Signed)
History     CSN: [id number]  Arrival date & time 02/24/11  1245   First MD Initiated Contact with Patient 02/24/11 1334      Chief Complaint  Patient presents with  . CP/ Converted SVT     (Consider location/radiation/quality/duration/timing/severity/associated sxs/prior treatment) HPI Comments: Patient reports that around 11 this morning he and his family were putting away Christmas decorations in the attic when he suddenly felt a pounding in his chest associated with what he thought was a rapid heart rate. He did get a little bit dizzy and felt like he might need to lay down. He tried to lay down and rest but while he was resting the symptoms progressively got worse and he started to develop severe chest pain. The patient did have history of a coronary blockage requiring a stent  For a 99% blockage back in 2007. He is currently taking aspirin and Plavix. He reports that he went jogging outside taking his son to his grandparents house and got back earlier this morning without any symptoms. EMS was called and en route in review of the EMS cardiac rhythm strip, the patient was in a supraventricular tachycardic rhythm. He was given adenosine 6 mg which resolved his symptoms. Here the patient has no palpitations, chest pain, shortness of breath, sweats, nausea. CP did not radiate anywhere at the time, not associated with other symptoms except lightheadedness.    The history is provided by the patient and the spouse.    Past Medical History  Diagnosis Date  . Coronary artery disease   . Chronic back pain   . Hyperlipidemia     Past Surgical History  Procedure Date  . Coronary stent placement   . Leg surgery     No family history on file.  History  Substance Use Topics  . Smoking status: Former Games developer  . Smokeless tobacco: Not on file  . Alcohol Use: No      Review of Systems  Cardiovascular: Positive for chest pain and palpitations.  Neurological: Positive for  light-headedness.  All other systems reviewed and are negative.    Allergies  Review of patient's allergies indicates no known allergies.  Home Medications   Current Outpatient Rx  Name Route Sig Dispense Refill  . ASPIRIN EC 81 MG PO TBEC Oral Take 81 mg by mouth daily.      . ATORVASTATIN CALCIUM 40 MG PO TABS Oral Take 40 mg by mouth daily.      Marland Kitchen CLOPIDOGREL BISULFATE 75 MG PO TABS Oral Take 75 mg by mouth daily.      . CYCLOBENZAPRINE HCL 10 MG PO TABS Oral Take 10 mg by mouth 3 (three) times daily as needed. Muscle spasms     . OMEGA-3 FATTY ACIDS 1000 MG PO CAPS Oral Take 1 g by mouth daily.      Marland Kitchen DILTIAZEM HCL 60 MG PO TABS Oral Take 1 tablet (60 mg total) by mouth 4 (four) times daily. 40 tablet 0  . NITROGLYCERIN 0.4 MG SL SUBL Sublingual Place 0.4 mg under the tongue every 5 (five) minutes as needed.        BP 124/93  Pulse 92  Temp(Src) 98.3 F (36.8 C) (Oral)  Resp 16  Ht 6' (1.829 m)  Wt 225 lb (102.059 kg)  BMI 30.52 kg/m2  SpO2 96%  Physical Exam  Vitals reviewed. Constitutional: He is oriented to person, place, and time. He appears well-developed and well-nourished.  HENT:  Head: Normocephalic and atraumatic.  Cardiovascular: Normal rate and normal heart sounds.   Pulmonary/Chest: Effort normal and breath sounds normal.  Abdominal: Soft. There is no tenderness.  Neurological: He is alert and oriented to person, place, and time.  Skin: Skin is warm.  Psychiatric: He has a normal mood and affect.    ED Course  Procedures (including critical care time)  Labs Reviewed  BASIC METABOLIC PANEL - Abnormal; Notable for the following:    Glucose, Bld 103 (*)    All other components within normal limits  TROPONIN I - Abnormal; Notable for the following:    Troponin I 0.40 (*)    All other components within normal limits  CBC  TROPONIN I  TSH   No results found.   1. Supraventricular tachycardia, paroxysmal     Sat of 95% is normal.  ECG at time  12:46 PM shows sinus tachy at rate 103, normal axis, interval.  No ST or T wave abn's.  No sig change from ECG on 03/26/2006.  MDM  I reviewed pt's heart cath from 2007, showed right dominant system with a mid 95-99% stenosis.  Pt reports he had a metal stent placed thus is on plavix and ASA.  His symptoms began and ended with SVT so rate likely was cause of CP.  Pt has not required any specific cardiology follow up.  His sign off date was last month, 5 years after stent placement.  I will let [place] cardiology know of situation and need for follow up.  Monitor here shows NSR and pt is asymptomatic at this time.        3:48 PM I spoke to Dr. Dietrich Pates who would start pt on diltiazem 240 mg and have hi follow up with Dr. Andee Lineman.  He advises to obtain cardiac markers however given that he had rather severe CP at the time.      6:10 PM I spoke to Dr. Daleen Squibb who reports that he doesn't think the troponin leak is from a primary coronary blockage, but more likely just from the SVT rate.  I agree.  Pt is comfortable going home and resting and will follow up with Dr. Andee Lineman.  Will add diltiazem to meds. Pt has NTG at home just in case.  Pt understands to take it easy, he will have to stay off of worse for now until cleared since he is lifting heavy items at work, climbs ladders to heights.    Kyle [name]. Oletta Lamas, MD 02/24/11 1810

## 2011-02-24 NOTE — Discharge Instructions (Signed)
Supraventricular Tachycardia    Supraventricular tachycardia (SVT) is an abnormal heart rhythm (arrhythmia) that causes the heart to beat very fast (tachycardia). This kind of fast heart beat originates in the upper chambers of the heart (atria). SVT can cause the heart to beat greater than 100 beats per minute. SVT can have a rapid burst of heartbeats. This can start and stop suddenly without warning and is called non-sustained. SVT can also be sustained, in which the heart beats at a continuous fast rate.   CAUSES   There can be different causes of SVT. Some of these include:  · Heart valve problems such as mitral valve prolapse.   · An enlarged heart (hypertrophic cardiomyopathy).   · Congenital heart problems.   · Heart inflammation (pericarditis).   · Hyperthyroidism.   · Low potassium or magnesium levels.   · Caffeine.   · Drug use such as cocaine, methamphetamines, or stimulants.   · Some over-the-counter medications such as:   · Decongestants.   · Diet medications.   · Herbal medications.   SYMPTOMS   Symptoms of SVT can vary. Symptoms depend on if the SVT is sustained or non-sustained. These can include:  · No symptoms (asymptomatic).   · An awareness of your heart beating rapidly (palpitations).   · Shortness of breath.   · Chest pain or pressure.   · If your blood pressure drops because of the SVT, you may experience:   · Fainting or near fainting.   · Weakness.   · Dizziness.   DIAGNOSIS   Different tests can be performed to diagnose SVT, such as:  · An electrocardiogram (EKG) is a painless test that records the electrical activity of your heart.   · Holter monitor. This is a 24 hour recording of your heart rhythm. You will be given a diary. Write down all symptoms that you have and what you were doing at the time you experienced symptoms.   · Arrhythmia monitor. This is a small device that your wear for several weeks. It records the heart rhythm when you have symptoms.   · Echocardiogram. This is an  imaging test to help detect abnormal heart structure such as congenital abnormalities, heart valve problems, or if your heart is enlarged.   · Stress Test. This test can help determine if the SVT is related to exercise.   · Electrophysiology Study (EPS). This is a procedure that evaluates your heart's electrical system and can help your caregiver find the cause of SVT.   TREATMENT   Treatment of SVT depends on the symptoms, how often it recurs and whether there are any underlying heart problems.   · If symptoms are rare and no other cardiac disease is present, no treatment may be needed.   · Blood work may be done to check potassium, magnesium and thyroid hormone levels to see if they are abnormal. If these levels are abnormal, treatment to correct the problems will occur.   Medications:  Your caregiver may use oral medications to treat SVT. These medications are given for long-term control of SVT. Medications may be used alone or in combination with other treatments. These medications work to slow nerve impulses in the heart muscle. These medications can also be used to treat high blood pressure. Some of these medications may include:  · Calcium channel blockers.   · Beta blockers.   · Lanoxin.   Nonsurgical procedures:  Nonsurgical techniques may be used if oral medications do not work. Some   examples include:  · Cardioversion. This technique uses either drugs or an electrical shock to restore a normal heart rhythm.   · Cardioversion drugs may be given through an intravenous (IV) line to help "reset" the heart rhythm.   · In electrical cardio version, the doctor shocks your heart to stop its beat for a split second. This helps to reset the heart to a normal rhythm.   · Ablation. This procedure is done under mild sedation. High frequency radio-wave energy is used to destroy the area of heart tissue responsible for the SVT.   HOME CARE INSTRUCTIONS   · Do not smoke.   · Only take medications prescribed by your  caregiver. Check with your caregiver before using over-the-counter medications.   · Check with your caregiver as to how much alcohol and caffeine (coffee, tea, colas, or chocolate) you may have.   · It is very important to keep all follow-up referrals and appointments in order to properly manage this problem.   SEEK IMMEDIATE MEDICAL CARE IF:  · You have dizziness.   · You faint or nearly faint.   · You have shortness of breath.   · You have chest pain or pressure.   · You have sudden nausea or vomiting.   · You have profuse sweating.   · You are concerned about how long your symptoms last.   · You are concerned about the frequency of your SVT episodes.   If you have the above symptoms, call your local emergency service immediately! Do not drive yourself to the hospital.  MAKE SURE YOU:   · Understand these instructions.   · Will watch your condition.   · Will get help right away if you are not doing well or get worse.   Document Released: 02/10/2005 Document Revised: 08/26/2010 Document Reviewed: 05/25/2008  [employer]® Patient Information ©2012 [employer].

## 2011-02-25 LAB — TSH: TSH: 1.906 u[IU]/mL (ref 0.350–4.500)

## 2011-02-25 NOTE — Telephone Encounter (Signed)
Would try to get patient will scheduled in the office as soon as possible

## 2011-02-26 ENCOUNTER — Telehealth: Payer: Self-pay | Admitting: Cardiology

## 2011-02-26 NOTE — Telephone Encounter (Signed)
Do you want any testing before OV on 1/24 ?

## 2011-02-26 NOTE — Telephone Encounter (Signed)
Mrs. Staples called the office stating that Kyle [name] will need a note to go back to work. States that he works At [employer] in the [employer]. He does have to lift at times on his job. Mr. Canterbury Has appointment with Dr. Andee Lineman on 03-20-2011. States that he is very fatigue, sore in his left shoulder at this time.

## 2011-02-27 ENCOUNTER — Encounter: Payer: Self-pay | Admitting: *Deleted

## 2011-02-27 NOTE — Telephone Encounter (Signed)
Mr. Dambrosia called the office wanting to know if Dr. Andee Lineman had made a decision about further testing Or trying to see him earlier than 03/20/2011.

## 2011-02-27 NOTE — Telephone Encounter (Signed)
Discussed below with patient.  Rescheduled appointment to 03/03/2011 with Dr. Arida.  Will also give work note to cover him till then.  States that he did not start Diltiazem because he was scared to - states his heart rate is generally pretty low.  Advised him that I can fax note to his employer or he can pick up.  He will call back to let me know his preference.   

## 2011-02-27 NOTE — Telephone Encounter (Signed)
Discussed below with patient.  Rescheduled appointment to 03/03/2011 with Dr. Kirke Corin.  Will also give work note to cover him till then.  States that he did not start Diltiazem because he was scared to - states his heart rate is generally pretty low.  Advised him that I can fax note to his employer or he can pick up.  He will call back to let me know his preference.

## 2011-03-03 ENCOUNTER — Encounter: Payer: Self-pay | Admitting: *Deleted

## 2011-03-03 ENCOUNTER — Encounter: Payer: Self-pay | Admitting: Cardiovascular Disease

## 2011-03-03 ENCOUNTER — Ambulatory Visit (INDEPENDENT_AMBULATORY_CARE_PROVIDER_SITE_OTHER): Payer: BC Managed Care – PPO | Admitting: Cardiovascular Disease

## 2011-03-03 VITALS — BP 136/92 | HR 93 | Ht 72.0 in | Wt 235.1 lb

## 2011-03-03 DIAGNOSIS — R079 Chest pain, unspecified: Secondary | ICD-10-CM

## 2011-03-03 DIAGNOSIS — I2510 Atherosclerotic heart disease of native coronary artery without angina pectoris: Secondary | ICD-10-CM | POA: Insufficient documentation

## 2011-03-03 DIAGNOSIS — I471 Supraventricular tachycardia: Secondary | ICD-10-CM

## 2011-03-03 MED ORDER — DILTIAZEM HCL ER COATED BEADS 180 MG PO CP24: 180.0000 mg | ORAL_CAPSULE | Freq: Every day | ORAL | Status: DC

## 2011-03-03 NOTE — Progress Notes (Signed)
HPI  This is a 51 year old man who is here today for a followup visit after recent emergency room encounter. The patient has known history of coronary artery disease status post angioplasty and drug-eluting stent placement to the right coronary artery in December of 2007. No cardiac events since then. He does report prolonged history of further palpitations but no previous documented arrhythmia. Recently on December 31 while he was doing some light work at home, he suddenly started having palpitations associated with dyspnea and chest pain. The symptoms persisted and worsened quickly. EMS were called and he was found to be in a narrow complex tachycardia with the reported heart rate of 180 beats per minute. The patient was given adenosine and converted to normal sinus rhythm. He denies any previous similar episodes. He had routine labs performed including CBC, a basic metabolic profile and TSH. All were within normal limits. His troponin was borderline elevated at 0.4. The patient was discharged home on diltiazem 60 mg every 6 hours. He has not started taking this medication due to concerns about her relatively low blood pressure. He has not had similar episodes since then. He continues to have mild chest discomfort and feels that his chest is sore.  No Known Allergies   Current Outpatient Prescriptions on File Prior to Visit  Medication Sig Dispense Refill  . aspirin EC 81 MG tablet Take 81 mg by mouth daily.        Marland Kitchen atorvastatin (LIPITOR) 40 MG tablet Take 40 mg by mouth at bedtime.       . clopidogrel (PLAVIX) 75 MG tablet Take 75 mg by mouth daily.        . nitroGLYCERIN (NITROSTAT) 0.4 MG SL tablet Place 0.4 mg under the tongue every 5 (five) minutes as needed.           Past Medical History  Diagnosis Date  . Chronic back pain   . Hyperlipidemia   . Coronary artery disease     s/p RCA PCI with a drug eluting stent  . PSVT (paroxysmal supraventricular tachycardia)      Past Surgical  History  Procedure Date  . Coronary stent placement   . Leg surgery   . Cardiac catheterization 2008    mild nonobstructive CAD. patent mid RCA stent     History reviewed. No pertinent family history.   History   Social History  . Marital Status: Married    Spouse Name: N/A    Number of Children: N/A  . Years of Education: N/A   Occupational History  . Not on file.   Social History Main Topics  . Smoking status: Former Games developer  . Smokeless tobacco: Not on file  . Alcohol Use: No  . Drug Use: No  . Sexually Active: Not on file   Other Topics Concern  . Not on file   Social History Narrative  . No narrative on file     PHYSICAL EXAM   BP 136/92  Pulse 93  Ht 6' (1.829 m)  Wt 235 lb 1.9 oz (106.65 kg)  BMI 31.89 kg/m2  Constitutional: He is oriented to person, place, and time. He appears well-developed and well-nourished. No distress.  HENT: No nasal discharge.  Head: Normocephalic and atraumatic.  Eyes: Pupils are equal, round, and reactive to light. Right eye exhibits no discharge. Left eye exhibits no discharge.  Neck: Normal range of motion. Neck supple. No JVD present. No thyromegaly present.  Cardiovascular: Normal rate, regular rhythm, normal heart sounds and  intact distal pulses. Exam reveals no gallop and no friction rub.  No murmur heard.  Pulmonary/Chest: Effort normal and breath sounds normal. No stridor. No respiratory distress. He has no wheezes. He has no rales. He exhibits no tenderness.  Abdominal: Soft. Bowel sounds are normal. He exhibits no distension. There is no tenderness. There is no rebound and no guarding.  Musculoskeletal: Normal range of motion. He exhibits no edema and no tenderness.  Neurological: He is alert and oriented to person, place, and time. Coordination normal.  Skin: Skin is warm and dry. No rash noted. He is not diaphoretic. No erythema. No pallor.  Psychiatric: He has a normal mood and affect. His behavior is normal.  Judgment and thought content normal.       ASSESSMENT AND PLAN

## 2011-03-03 NOTE — Assessment & Plan Note (Signed)
The patient had recent supraventricular tachycardia that converted to normal sinus rhythm with adenosine. His labs were unremarkable. I recommend an echocardiogram to evaluate for any structural or valvular abnormalities. I will also switch his short-acting diltiazem to extended release 180 mg once daily. I instructed him to start taking the medication. If he fails 1 medication, an antiarrhythmic medication or ablation should be considered.

## 2011-03-03 NOTE — Progress Notes (Signed)
Never started Diltiazem.

## 2011-03-03 NOTE — Telephone Encounter (Signed)
Patient in office today to see Dr. Kirke Corin.

## 2011-03-03 NOTE — Assessment & Plan Note (Signed)
The patient had borderline elevated troponin in the setting of tachycardia likely due to supply demand ischemia. However, he has known history of coronary artery disease and continues to have intermittent mild chest discomfort which seems to be atypical overall. I recommend a treadmill nuclear stress test for further evaluation. He is to stay off work until his cardiac workup is completed.

## 2011-03-03 NOTE — Patient Instructions (Signed)
Follow up as scheduled. Start Diltiazem 180 mg daily. Do not take diltiazem you were given at discharge from the hospital. Your physician has requested that you have an echocardiogram. Echocardiography is a painless test that uses sound waves to create images of your heart. It provides your doctor with information about the size and shape of your heart and how well your heart's chambers and valves are working. This procedure takes approximately one hour. There are no restrictions for this procedure. Your physician has requested that you have en exercise stress cardiolite. For further information please visit [email or web address] Please follow instruction sheet, as given.

## 2011-03-05 ENCOUNTER — Ambulatory Visit: Payer: BC Managed Care – PPO | Admitting: Physician Assistant

## 2011-03-05 ENCOUNTER — Encounter: Payer: Self-pay | Admitting: Cardiovascular Disease

## 2011-03-05 ENCOUNTER — Telehealth: Payer: Self-pay | Admitting: *Deleted

## 2011-03-05 NOTE — Telephone Encounter (Signed)
Auth # [id number] exp 04/03/11

## 2011-03-05 NOTE — Telephone Encounter (Signed)
Stress Cardiolite scheduled for 03-07-2011  Checking percert

## 2011-03-06 ENCOUNTER — Other Ambulatory Visit (INDEPENDENT_AMBULATORY_CARE_PROVIDER_SITE_OTHER): Payer: BC Managed Care – PPO | Admitting: *Deleted

## 2011-03-06 DIAGNOSIS — R079 Chest pain, unspecified: Secondary | ICD-10-CM

## 2011-03-06 DIAGNOSIS — R072 Precordial pain: Secondary | ICD-10-CM

## 2011-03-06 DIAGNOSIS — I2510 Atherosclerotic heart disease of native coronary artery without angina pectoris: Secondary | ICD-10-CM

## 2011-03-07 DIAGNOSIS — R079 Chest pain, unspecified: Secondary | ICD-10-CM

## 2011-03-13 ENCOUNTER — Ambulatory Visit (INDEPENDENT_AMBULATORY_CARE_PROVIDER_SITE_OTHER): Payer: BC Managed Care – PPO | Admitting: Cardiovascular Disease

## 2011-03-13 ENCOUNTER — Encounter: Payer: Self-pay | Admitting: Cardiovascular Disease

## 2011-03-13 ENCOUNTER — Telehealth: Payer: Self-pay | Admitting: *Deleted

## 2011-03-13 ENCOUNTER — Encounter: Payer: Self-pay | Admitting: *Deleted

## 2011-03-13 DIAGNOSIS — R0602 Shortness of breath: Secondary | ICD-10-CM

## 2011-03-13 DIAGNOSIS — I471 Supraventricular tachycardia: Secondary | ICD-10-CM

## 2011-03-13 DIAGNOSIS — I2510 Atherosclerotic heart disease of native coronary artery without angina pectoris: Secondary | ICD-10-CM

## 2011-03-13 DIAGNOSIS — R079 Chest pain, unspecified: Secondary | ICD-10-CM

## 2011-03-13 NOTE — Assessment & Plan Note (Signed)
He has not had any tachycardia since he was started on diltiazem. This will be continued.

## 2011-03-13 NOTE — Assessment & Plan Note (Signed)
Stress test showed evidence of ischemia in the LAD distribution. Most recent cardiac catheterization in 2008 showed a 40% proximal LAD stenosis. He has chest tightness at high work load. He is on good medical therapy. I recommend proceeding with cardiac catheterization and possible coronary intervention. Risks, benefits and alternatives were discussed with the patient. This will be scheduled for next week. I recommended to him to stay off work until after the catheterization. Continue aspirin and Plavix.

## 2011-03-13 NOTE — Patient Instructions (Signed)
Your physician recommends that you go to the [facility] for lab work/chest x-ray: CBC/BMET/PT/PTT/X-RAY Your physician recommends that you continue on your current medications as directed. Please refer to the Current Medication list given to you today. Your physician has requested that you have a cardiac catheterization. Cardiac catheterization is used to diagnose and/or treat various heart conditions. Doctors may recommend this procedure for a number of different reasons. The most common reason is to evaluate chest pain. Chest pain can be a symptom of coronary artery disease (CAD), and cardiac catheterization can show whether plaque is narrowing or blocking your heart's arteries. This procedure is also used to evaluate the valves, as well as measure the blood flow and oxygen levels in different parts of your heart. For further information please visit [email or web address] Please follow instruction sheet, as given.

## 2011-03-13 NOTE — Progress Notes (Signed)
HPI  This is a 51 year old man who is here today for a followup visit. He was seen recently for supraventricular tachycardia. He converted to sinus rhythm with adenosine. His heart rate was reported to be 180 beats per minute. He did have minor elevation in troponin. He has known history of coronary artery disease status post angioplasty and drug-eluting stent placement to the right coronary artery in 2007. Most recent cardiac catheterization in 2008 showed a patent stent with a 40% stenosis in the proximal left anterior descending artery and no other obstructive disease. He underwent evaluation with a treadmill nuclear stress test. He was able to exercise for 8 minutes without ECG changes. However, he started having substernal chest tightness midway through his stress test. Nuclear imaging showed reversible defect in the anterior and anterolateral wall suggestive of ischemia with normal ejection fraction. The patient reports no chest pain at the level of physical activities. However, he did have some episodes of chest tightness recently. He has not had any further tachycardia although he continues to complain of mild palpitations.  No Known Allergies   Current Outpatient Prescriptions on File Prior to Visit  Medication Sig Dispense Refill  . aspirin EC 81 MG tablet Take 81 mg by mouth daily.        Marland Kitchen atorvastatin (LIPITOR) 40 MG tablet Take 40 mg by mouth at bedtime.       . clopidogrel (PLAVIX) 75 MG tablet Take 75 mg by mouth daily.        Marland Kitchen diltiazem (CARDIZEM CD) 180 MG 24 hr capsule Take 1 capsule (180 mg total) by mouth daily.  30 capsule  6  . KRILL OIL PO Take by mouth daily.        . nitroGLYCERIN (NITROSTAT) 0.4 MG SL tablet Place 0.4 mg under the tongue every 5 (five) minutes as needed.           Past Medical History  Diagnosis Date  . Chronic back pain   . Hyperlipidemia   . Coronary artery disease     s/p RCA PCI with a drug eluting stent  . PSVT (paroxysmal supraventricular  tachycardia)      Past Surgical History  Procedure Date  . Coronary stent placement     mid RCA: 3.5 X 16 mm Taxus DES  . Leg surgery   . Cardiac catheterization 2008    mild nonobstructive CAD (40% proximal LAD stenosis). patent mid RCA stent     No family history on file.   History   Social History  . Marital Status: Married    Spouse Name: N/A    Number of Children: N/A  . Years of Education: N/A   Occupational History  .  [employer]   Social History Main Topics  . Smoking status: Former Smoker    Quit date: 02/24/1998  . Smokeless tobacco: Not on file  . Alcohol Use: No  . Drug Use: No  . Sexually Active: Not on file   Other Topics Concern  . Not on file   Social History Narrative  . No narrative on file     PHYSICAL EXAM   BP 124/84  Pulse 80  Ht 6' (1.829 m)  Wt 238 lb 12.8 oz (108.319 kg)  BMI 32.39 kg/m2  Constitutional: He is oriented to person, place, and time. He appears well-developed and well-nourished. No distress.  HENT: No nasal discharge.  Head: Normocephalic and atraumatic.  Eyes: Pupils are equal, round, and reactive to light. Right  eye exhibits no discharge. Left eye exhibits no discharge.  Neck: Normal range of motion. Neck supple. No JVD present. No thyromegaly present.  Cardiovascular: Normal rate, regular rhythm, normal heart sounds and intact distal pulses. Exam reveals no gallop and no friction rub.  No murmur heard.  Pulmonary/Chest: Effort normal and breath sounds normal. No stridor. No respiratory distress. He has no wheezes. He has no rales. He exhibits no tenderness.  Abdominal: Soft. Bowel sounds are normal. He exhibits no distension. There is no tenderness. There is no rebound and no guarding.  Musculoskeletal: Normal range of motion. He exhibits no edema and no tenderness.  Neurological: He is alert and oriented to person, place, and time. Coordination normal.  Skin: Skin is warm and dry. No rash noted. He  is not diaphoretic. No erythema. No pallor.  Psychiatric: He has a normal mood and affect. His behavior is normal. Judgment and thought content normal.        ASSESSMENT AND PLAN

## 2011-03-19 ENCOUNTER — Encounter (HOSPITAL_BASED_OUTPATIENT_CLINIC_OR_DEPARTMENT_OTHER)
Admission: RE | Disposition: A | Discharge status: Home / Self Care | Payer: Self-pay | Source: Ambulatory Visit | Attending: Cardiovascular Disease

## 2011-03-19 ENCOUNTER — Telehealth: Payer: Self-pay | Admitting: *Deleted

## 2011-03-19 ENCOUNTER — Inpatient Hospital Stay (HOSPITAL_BASED_OUTPATIENT_CLINIC_OR_DEPARTMENT_OTHER)
Admission: RE | Admit: 2011-03-19 | Discharge: 2011-03-19 | Disposition: A | Discharge status: Home / Self Care | Payer: BC Managed Care – PPO | Source: Ambulatory Visit | Attending: Cardiovascular Disease | Admitting: Cardiovascular Disease

## 2011-03-19 DIAGNOSIS — Z9861 Coronary angioplasty status: Secondary | ICD-10-CM | POA: Insufficient documentation

## 2011-03-19 DIAGNOSIS — I2510 Atherosclerotic heart disease of native coronary artery without angina pectoris: Secondary | ICD-10-CM

## 2011-03-19 SURGERY — JV LEFT HEART CATHETERIZATION WITH CORONARY ANGIOGRAM
Anesthesia: Moderate Sedation

## 2011-03-19 MED ORDER — SODIUM CHLORIDE 0.9 % IV SOLN: INTRAVENOUS | Status: AC

## 2011-03-19 MED ORDER — SODIUM CHLORIDE 0.9 % IJ SOLN: 3.0000 mL | INTRAMUSCULAR | Status: DC | PRN

## 2011-03-19 MED ORDER — ACETAMINOPHEN 325 MG PO TABS: 650.0000 mg | ORAL_TABLET | ORAL | Status: DC | PRN

## 2011-03-19 MED ORDER — CLOPIDOGREL BISULFATE 75 MG PO TABS: 75.0000 mg | ORAL_TABLET | ORAL | Status: DC

## 2011-03-19 MED ORDER — ATORVASTATIN CALCIUM 40 MG PO TABS: 40.0000 mg | ORAL_TABLET | Freq: Every day | ORAL | Status: DC

## 2011-03-19 MED ORDER — SODIUM CHLORIDE 0.9 % IV SOLN: 250.0000 mL | INTRAVENOUS | Status: DC | PRN

## 2011-03-19 MED ORDER — SODIUM CHLORIDE 0.9 % IJ SOLN: 3.0000 mL | Freq: Two times a day (BID) | INTRAMUSCULAR | Status: DC

## 2011-03-19 MED ORDER — SODIUM CHLORIDE 0.9 % IV SOLN
INTRAVENOUS | Status: DC
  Administered 2011-03-19: 10:00:00 via INTRAVENOUS

## 2011-03-19 MED ORDER — ASPIRIN 81 MG PO CHEW
324.0000 mg | CHEWABLE_TABLET | ORAL | Status: AC
  Administered 2011-03-19: 324 mg via ORAL

## 2011-03-19 NOTE — Progress Notes (Signed)
TR band removed without bleeding.  Tegaderm dressing applied.  Discharge instructions completed.  Discharged to home via wheelchair.

## 2011-03-19 NOTE — Op Note (Signed)
  Cardiac Catheterization Procedure Note  Name: Kyle [name] MRN: [id number] DOB: [birth year]  Procedure: Left Heart Cath, Selective Coronary Angiography, LV angiography  Indication: This is a 51 year old male with known history of coronary artery disease status post angioplasty and stent placement to the right coronary artery. He presented with SVT which was treated with adenosine. His troponin was borderline elevated. He had mild chest discomfort after that. He underwent a treadmill nuclear stress test which showed possible anterior wall ischemia. Cardiac catheterization was thus recommended.  Medications:  Sedation:  2 mg IV Versed, 75 mcg IV Fentanyl  Contrast:  85 ml Omnipaque   Procedural Details: The right wrist was prepped, draped, and anesthetized with 1% lidocaine. Using the modified Seldinger technique, a 5 French sheath was introduced into the right radial artery. 3 mg of verapamil was administered through the sheath, weight-based unfractionated heparin was administered intravenously. A TIG 4 catheter was used for selective coronary angiography. A pigtail catheter was used for left ventriculography. Catheter exchanges were performed over an exchange length guidewire. There were no immediate procedural complications. A TR band was used for radial hemostasis at the completion of the procedure.  The patient was transferred to the post catheterization recovery area for further monitoring.  Procedural Findings:  Hemodynamics: AO:  118/73   mmHg LV:  117/10    mmHg LVEDP: 9  mmHg  Coronary angiography: Coronary dominance: Right   Left Main:  Normal in size and free of any significant disease.  Left Anterior Descending (LAD):  Normal in size and mildly calcified in the mid segment. There is a 40-50% discrete eccentric stenosis in the midsegment right after first septal perforator. The rest of the LAD has minor irregularities without evidence of obstructive disease.  1st diagonal  (D1):  Medium in size with 50% ostial stenosis.  2nd diagonal (D2):  Medium in size and free of significant disease.  3rd diagonal (D3):  Medium size and free of significant disease.  Circumflex (LCx):  Normal in size and nondominant. There is a 20% ostial stenosis. The rest of the vessel has minor irregularities without obstructive disease.  1st obtuse marginal:  Normal in size and free of significant disease.  2nd obtuse marginal:  Small size with 20% proximal stenosis.  3rd obtuse marginal:  Small in size and free of significant disease.   Right Coronary Artery: Normal in size and dominant. There is a 20% proximal stenosis. The stent is noted in the midsegment which is patent with mild 10% instent restenosis. There is a 20% tubular stenosis in the distal RCA.  posterior descending artery: Normal in size and free of significant disease.  posterior lateral branch:  Normal in size with minor irregularities. Left ventriculography: Left ventricular systolic function is normal , LVEF is estimated at 60 %, there is no significant mitral regurgitation   Final Conclusions:   1. Patent RCA stent with minor instent restenosis. Moderate mid LAD stenosis without evidence of obstructive coronary artery disease. 2. Normal LV systolic function. 3. Normal LVEDP.  Recommendations: I recommend continuing medical therapy. The patient is known to have a moderate LAD stenosis which does not seem to be different from before. The ischemia in the anterolateral segment could be due to the disease in ostial first diagonal. No revascularization is recommended for this.  Lorine Bears MD, [facility] 03/19/2011, 11:13 AM

## 2011-03-19 NOTE — Progress Notes (Signed)
Patient arrived into post-op area with TR band intact, it was applied @ 1110, 12 cc of air in band.

## 2011-03-19 NOTE — Interval H&P Note (Signed)
History and Physical Interval Note:  03/19/2011 10:41 AM  Kyle [name]  has presented today for surgery, with the diagnosis of cp  The various methods of treatment have been discussed with the patient. After consideration of risks, benefits and other options for treatment, the patient has consented to  Procedure(s): JV LEFT HEART CATHETERIZATION WITH CORONARY ANGIOGRAM as a surgical intervention .  The patients' history has been reviewed, patient examined, no change in status, stable for surgery.  I have reviewed the patients' chart and labs.  Questions were answered to the patient's satisfaction.     Lorine Bears

## 2011-03-19 NOTE — H&P (View-Only) (Signed)
HPI  This is a 51-year-old man who is here today for a followup visit. He was seen recently for supraventricular tachycardia. He converted to sinus rhythm with adenosine. His heart rate was reported to be 180 beats per minute. He did have minor elevation in troponin. He has known history of coronary artery disease status post angioplasty and drug-eluting stent placement to the right coronary artery in 2007. Most recent cardiac catheterization in 2008 showed a patent stent with a 40% stenosis in the proximal left anterior descending artery and no other obstructive disease. He underwent evaluation with a treadmill nuclear stress test. He was able to exercise for 8 minutes without ECG changes. However, he started having substernal chest tightness midway through his stress test. Nuclear imaging showed reversible defect in the anterior and anterolateral wall suggestive of ischemia with normal ejection fraction. The patient reports no chest pain at the level of physical activities. However, he did have some episodes of chest tightness recently. He has not had any further tachycardia although he continues to complain of mild palpitations.  No Known Allergies   Current Outpatient Prescriptions on File Prior to Visit  Medication Sig Dispense Refill  . aspirin EC 81 MG tablet Take 81 mg by mouth daily.        . atorvastatin (LIPITOR) 40 MG tablet Take 40 mg by mouth at bedtime.       . clopidogrel (PLAVIX) 75 MG tablet Take 75 mg by mouth daily.        . diltiazem (CARDIZEM CD) 180 MG 24 hr capsule Take 1 capsule (180 mg total) by mouth daily.  30 capsule  6  . KRILL OIL PO Take by mouth daily.        . nitroGLYCERIN (NITROSTAT) 0.4 MG SL tablet Place 0.4 mg under the tongue every 5 (five) minutes as needed.           Past Medical History  Diagnosis Date  . Chronic back pain   . Hyperlipidemia   . Coronary artery disease     s/p RCA PCI with a drug eluting stent  . PSVT (paroxysmal supraventricular  tachycardia)      Past Surgical History  Procedure Date  . Coronary stent placement     mid RCA: 3.5 X 16 mm Taxus DES  . Leg surgery   . Cardiac catheterization 2008    mild nonobstructive CAD (40% proximal LAD stenosis). patent mid RCA stent     No family history on file.   History   Social History  . Marital Status: Married    Spouse Name: N/A    Number of Children: N/A  . Years of Education: N/A   Occupational History  .  [employer]   Social History Main Topics  . Smoking status: Former Smoker    Quit date: 02/24/1998  . Smokeless tobacco: Not on file  . Alcohol Use: No  . Drug Use: No  . Sexually Active: Not on file   Other Topics Concern  . Not on file   Social History Narrative  . No narrative on file     PHYSICAL EXAM   BP 124/84  Pulse 80  Ht 6' (1.829 m)  Wt 238 lb 12.8 oz (108.319 kg)  BMI 32.39 kg/m2  Constitutional: He is oriented to person, place, and time. He appears well-developed and well-nourished. No distress.  HENT: No nasal discharge.  Head: Normocephalic and atraumatic.  Eyes: Pupils are equal, round, and reactive to light. Right   eye exhibits no discharge. Left eye exhibits no discharge.  Neck: Normal range of motion. Neck supple. No JVD present. No thyromegaly present.  Cardiovascular: Normal rate, regular rhythm, normal heart sounds and intact distal pulses. Exam reveals no gallop and no friction rub.  No murmur heard.  Pulmonary/Chest: Effort normal and breath sounds normal. No stridor. No respiratory distress. He has no wheezes. He has no rales. He exhibits no tenderness.  Abdominal: Soft. Bowel sounds are normal. He exhibits no distension. There is no tenderness. There is no rebound and no guarding.  Musculoskeletal: Normal range of motion. He exhibits no edema and no tenderness.  Neurological: He is alert and oriented to person, place, and time. Coordination normal.  Skin: Skin is warm and dry. No rash noted. He  is not diaphoretic. No erythema. No pallor.  Psychiatric: He has a normal mood and affect. His behavior is normal. Judgment and thought content normal.        ASSESSMENT AND PLAN   

## 2011-03-19 NOTE — Telephone Encounter (Signed)
Message copied by Arlyss Gandy on Wed Mar 19, 2011 12:05 PM ------      Message from: Lorine Bears A      Created: Wed Mar 19, 2011 11:34 AM       He is out of Lipitor. Send him an Rx for 30 tab with 6 refills.

## 2011-03-20 ENCOUNTER — Encounter: Payer: Self-pay | Admitting: *Deleted

## 2011-03-20 ENCOUNTER — Encounter: Payer: BC Managed Care – PPO | Admitting: Cardiology

## 2011-04-01 ENCOUNTER — Encounter: Payer: BC Managed Care – PPO | Admitting: Cardiovascular Disease

## 2011-04-15 ENCOUNTER — Encounter: Payer: Self-pay | Admitting: Cardiovascular Disease

## 2011-04-15 ENCOUNTER — Encounter: Payer: BC Managed Care – PPO | Admitting: Cardiovascular Disease

## 2011-04-22 NOTE — Telephone Encounter (Signed)
In error

## 2011-11-20 ENCOUNTER — Other Ambulatory Visit: Payer: Self-pay | Admitting: Cardiology

## 2011-11-20 MED ORDER — DILTIAZEM HCL ER COATED BEADS 180 MG PO CP24: 180.0000 mg | ORAL_CAPSULE | Freq: Every day | ORAL | Status: DC

## 2011-11-20 MED ORDER — ATORVASTATIN CALCIUM 40 MG PO TABS: 40.0000 mg | ORAL_TABLET | Freq: Every day | ORAL | Status: DC

## 2012-02-03 ENCOUNTER — Encounter: Payer: Self-pay | Admitting: Internal Medicine

## 2012-06-29 ENCOUNTER — Encounter: Payer: Self-pay | Admitting: *Deleted

## 2012-06-29 ENCOUNTER — Telehealth: Payer: Self-pay | Admitting: Internal Medicine

## 2012-06-29 ENCOUNTER — Ambulatory Visit (INDEPENDENT_AMBULATORY_CARE_PROVIDER_SITE_OTHER): Payer: BC Managed Care – PPO | Admitting: Internal Medicine

## 2012-06-29 ENCOUNTER — Encounter: Payer: Self-pay | Admitting: Internal Medicine

## 2012-06-29 VITALS — BP 144/90 | HR 76 | Ht 72.0 in | Wt 227.8 lb

## 2012-06-29 DIAGNOSIS — I2510 Atherosclerotic heart disease of native coronary artery without angina pectoris: Secondary | ICD-10-CM

## 2012-06-29 DIAGNOSIS — I498 Other specified cardiac arrhythmias: Secondary | ICD-10-CM

## 2012-06-29 DIAGNOSIS — I471 Supraventricular tachycardia, unspecified: Secondary | ICD-10-CM

## 2012-06-29 LAB — CBC WITH DIFFERENTIAL/PLATELET
Basophils Absolute: 0 10*3/uL (ref 0.0–0.1)
Basophils Relative: 0.5 % (ref 0.0–3.0)
Eosinophils Relative: 2.8 % (ref 0.0–5.0)
HCT: 45.4 % (ref 39.0–52.0)
Hemoglobin: 15.7 g/dL (ref 13.0–17.0)
Lymphocytes Relative: 32.6 % (ref 12.0–46.0)
Lymphs Abs: 2.7 10*3/uL (ref 0.7–4.0)
Monocytes Relative: 10.7 % (ref 3.0–12.0)
Neutro Abs: 4.4 10*3/uL (ref 1.4–7.7)
RBC: 5.24 Mil/uL (ref 4.22–5.81)

## 2012-06-29 LAB — BASIC METABOLIC PANEL
Calcium: 9.2 mg/dL (ref 8.4–10.5)
GFR: 75.59 mL/min (ref >60.00)
Glucose, Bld: 82 mg/dL (ref 70–99)
Potassium: 3.9 mEq/L (ref 3.5–5.1)
Sodium: 137 mEq/L (ref 135–145)

## 2012-06-29 MED ORDER — CLOPIDOGREL BISULFATE 75 MG PO TABS: 75.0000 mg | ORAL_TABLET | Freq: Every day | ORAL | Status: DC

## 2012-06-29 MED ORDER — DILTIAZEM HCL ER COATED BEADS 180 MG PO CP24: 180.0000 mg | ORAL_CAPSULE | Freq: Every day | ORAL | Status: DC

## 2012-06-29 NOTE — Progress Notes (Signed)
HPI Kyle [name] is referred today for evaluation of SVT. He is a very pleasant middle-age man with a history of tachycardia palpitations and documented SVT at over 200 beats per minute. These episodes start and stop suddenly, and have been terminated in the past with intravenous adenosine. During his episodes of SVT, he experiences severe chest discomfort. He also has nausea and diaphoresis and shortness of breath. Despite calcium channel blockers, he has continued to have SVT. At times, vagal maneuvers will terminate his symptoms. He has not had frank syncope, but he does get dizzy when he has SVT. These episodes have lasted over an hour in the past. No Known Allergies   Current Outpatient Prescriptions  Medication Sig Dispense Refill  . aspirin EC 81 MG tablet Take 81 mg by mouth daily.        . atorvastatin (LIPITOR) 40 MG tablet Take 1 tablet (40 mg total) by mouth at bedtime.  30 tablet  6  . clopidogrel (PLAVIX) 75 MG tablet Take 1 tablet (75 mg total) by mouth daily.  90 tablet  3  . diltiazem (CARDIZEM CD) 180 MG 24 hr capsule Take 1 capsule (180 mg total) by mouth daily.  90 capsule  3  . KRILL OIL PO Take by mouth daily.        . nitroGLYCERIN (NITROSTAT) 0.4 MG SL tablet Place 0.4 mg under the tongue every 5 (five) minutes as needed.         No current facility-administered medications for this visit.     Past Medical History  Diagnosis Date  . Chronic back pain   . Hyperlipidemia   . Coronary artery disease     s/p RCA PCI with a drug eluting stent  . PSVT (paroxysmal supraventricular tachycardia)     ROS:   All systems reviewed and negative except as noted in the HPI.   Past Surgical History  Procedure Laterality Date  . Coronary stent placement      mid RCA: 3.5 X 16 mm Taxus DES  . Leg surgery    . Cardiac catheterization  2008    mild nonobstructive CAD (40% proximal LAD stenosis). patent mid RCA stent     No family history on file.   History   Social  History  . Marital Status: Married    Spouse Name: N/A    Number of Children: N/A  . Years of Education: N/A   Occupational History  .  [employer]   Social History Main Topics  . Smoking status: Former Smoker    Quit date: 02/24/1998  . Smokeless tobacco: Not on file  . Alcohol Use: No  . Drug Use: No  . Sexually Active: Not on file   Other Topics Concern  . Not on file   Social History Narrative  . No narrative on file     BP 144/90  Pulse 76  Ht 6' (1.829 m)  Wt 227 lb 12.8 oz (103.329 kg)  BMI 30.89 kg/m2  Physical Exam:  Well appearing obese, middle-aged man,NAD HEENT: Unremarkable Neck:  6 cm JVD, no thyromegally Lungs:  Clear with no wheezes, rales, or rhonchi. HEART:  Regular rate rhythm, no murmurs, no rubs, no clicks Abd:  soft, positive bowel sounds, no organomegally, no rebound, no guarding Ext:  2 plus pulses, no edema, no cyanosis, no clubbing Skin:  No rashes no nodules Neuro:  CN II through XII intact, motor grossly intact  EKG - normal sinus rhythm with normal axis   and intervals, no ventricular preexcitation.  Assess/Plan: 

## 2012-06-29 NOTE — Assessment & Plan Note (Signed)
The patient has recurrent supraventricular tachycardia despite medical therapy with calcium channel blockers. I discussed the treatment options with the detail including additional medical therapy versus proceeding with catheter ablation of his SVT. The risk, goals, benefits, and expectations of catheter ablation have been discussed with the patient and his wife, and they wish to proceed.

## 2012-06-29 NOTE — Telephone Encounter (Signed)
PER DR TAYLOR, IF STENT HAS BEEN IN PLACE FOR YEARS THAN MAY HOLD PLAVIX FOR  2 DAYS PRIOR.  PT'S WIFE AWARE .Zack Seal

## 2012-06-29 NOTE — Telephone Encounter (Signed)
New Prob    Calling to see if it is ok that pt took PRAVIX last night and when he needs to stop taking before procedure on 5/9. Would like to speak to nurse.

## 2012-06-29 NOTE — Assessment & Plan Note (Signed)
He has anginal symptoms with his SVT. At the time of his most recent heart catheterization, he had no new obstructive lesions in his coronary anatomy.

## 2012-06-29 NOTE — Patient Instructions (Addendum)

## 2012-07-01 ENCOUNTER — Encounter (HOSPITAL_COMMUNITY): Payer: Self-pay | Admitting: Pharmacy Technician

## 2012-07-02 ENCOUNTER — Encounter (HOSPITAL_COMMUNITY)
Admission: RE | Disposition: A | Discharge status: Home / Self Care | Payer: Self-pay | Source: Ambulatory Visit | Attending: Internal Medicine

## 2012-07-02 ENCOUNTER — Ambulatory Visit (HOSPITAL_COMMUNITY)
Admission: RE | Admit: 2012-07-02 | Discharge: 2012-07-02 | Disposition: A | Discharge status: Home / Self Care | Payer: BC Managed Care – PPO | Source: Ambulatory Visit | Attending: Internal Medicine | Admitting: Internal Medicine

## 2012-07-02 DIAGNOSIS — I471 Supraventricular tachycardia, unspecified: Secondary | ICD-10-CM | POA: Insufficient documentation

## 2012-07-02 DIAGNOSIS — E785 Hyperlipidemia, unspecified: Secondary | ICD-10-CM | POA: Insufficient documentation

## 2012-07-02 DIAGNOSIS — G8929 Other chronic pain: Secondary | ICD-10-CM | POA: Insufficient documentation

## 2012-07-02 DIAGNOSIS — M549 Dorsalgia, unspecified: Secondary | ICD-10-CM | POA: Insufficient documentation

## 2012-07-02 DIAGNOSIS — Z9861 Coronary angioplasty status: Secondary | ICD-10-CM | POA: Insufficient documentation

## 2012-07-02 DIAGNOSIS — I2510 Atherosclerotic heart disease of native coronary artery without angina pectoris: Secondary | ICD-10-CM

## 2012-07-02 HISTORY — PX: SUPRAVENTRICULAR TACHYCARDIA ABLATION: SHX5492

## 2012-07-02 SURGERY — SUPRAVENTRICULAR TACHYCARDIA ABLATION
Anesthesia: LOCAL

## 2012-07-02 MED ORDER — FENTANYL CITRATE 0.05 MG/ML IJ SOLN
INTRAMUSCULAR | Status: AC
  Filled 2012-07-02: qty 2

## 2012-07-02 MED ORDER — HEPARIN (PORCINE) IN NACL 2-0.9 UNIT/ML-% IJ SOLN
INTRAMUSCULAR | Status: AC
  Filled 2012-07-02: qty 500

## 2012-07-02 MED ORDER — CLOPIDOGREL BISULFATE 75 MG PO TABS: 75.0000 mg | ORAL_TABLET | Freq: Every day | ORAL | Status: DC

## 2012-07-02 MED ORDER — ONDANSETRON HCL 4 MG/2ML IJ SOLN: 4.0000 mg | Freq: Four times a day (QID) | INTRAMUSCULAR | Status: DC | PRN

## 2012-07-02 MED ORDER — MIDAZOLAM HCL 5 MG/5ML IJ SOLN
INTRAMUSCULAR | Status: AC
  Filled 2012-07-02: qty 5

## 2012-07-02 MED ORDER — ATORVASTATIN CALCIUM 40 MG PO TABS
40.0000 mg | ORAL_TABLET | Freq: Every day | ORAL | Status: DC
  Filled 2012-07-02: qty 1

## 2012-07-02 MED ORDER — HYDROXYUREA 500 MG PO CAPS
ORAL_CAPSULE | ORAL | Status: AC
  Filled 2012-07-02: qty 1

## 2012-07-02 MED ORDER — NITROGLYCERIN 0.4 MG SL SUBL: 0.4000 mg | SUBLINGUAL_TABLET | SUBLINGUAL | Status: DC | PRN

## 2012-07-02 MED ORDER — SODIUM CHLORIDE 0.9 % IJ SOLN: 3.0000 mL | Freq: Two times a day (BID) | INTRAMUSCULAR | Status: DC

## 2012-07-02 MED ORDER — BUPIVACAINE HCL (PF) 0.25 % IJ SOLN
INTRAMUSCULAR | Status: AC
  Filled 2012-07-02: qty 60

## 2012-07-02 MED ORDER — ASPIRIN EC 81 MG PO TBEC
81.0000 mg | DELAYED_RELEASE_TABLET | Freq: Every day | ORAL | Status: DC
  Filled 2012-07-02: qty 1

## 2012-07-02 MED ORDER — SODIUM CHLORIDE 0.9 % IV SOLN: 250.0000 mL | INTRAVENOUS | Status: DC | PRN

## 2012-07-02 MED ORDER — SODIUM CHLORIDE 0.9 % IJ SOLN: 3.0000 mL | INTRAMUSCULAR | Status: DC | PRN

## 2012-07-02 MED ORDER — DILTIAZEM HCL ER COATED BEADS 180 MG PO CP24
180.0000 mg | ORAL_CAPSULE | Freq: Every day | ORAL | Status: DC
  Filled 2012-07-02: qty 1

## 2012-07-02 MED ORDER — ACETAMINOPHEN 325 MG PO TABS: 650.0000 mg | ORAL_TABLET | ORAL | Status: DC | PRN

## 2012-07-02 NOTE — Progress Notes (Signed)
Bedrest completed.  Walked in hall w/o cp or sob.  SR 90's w/ ambulation.  Refused meds b/c he wants to take them at home.  Advised him of MD order to stop diltiazem.   D/C instructions reviewed w/ pt and family.  SL removed by NT Jomarie Longs.  Rt groin level 0.  Palp DP.  To lobby per w/c w/ NT.

## 2012-07-02 NOTE — Op Note (Signed)
EPS/RFA of AVNRT without immediate complication. K#[id number].

## 2012-07-02 NOTE — H&P (View-Only) (Signed)
HPI Mr. Kyle [name] is referred today for evaluation of SVT. He is a very pleasant middle-age man with a history of tachycardia palpitations and documented SVT at over 200 beats per minute. These episodes start and stop suddenly, and have been terminated in the past with intravenous adenosine. During his episodes of SVT, he experiences severe chest discomfort. He also has nausea and diaphoresis and shortness of breath. Despite calcium channel blockers, he has continued to have SVT. At times, vagal maneuvers will terminate his symptoms. He has not had frank syncope, but he does get dizzy when he has SVT. These episodes have lasted over an hour in the past. No Known Allergies   Current Outpatient Prescriptions  Medication Sig Dispense Refill  . aspirin EC 81 MG tablet Take 81 mg by mouth daily.        Marland Kitchen atorvastatin (LIPITOR) 40 MG tablet Take 1 tablet (40 mg total) by mouth at bedtime.  30 tablet  6  . clopidogrel (PLAVIX) 75 MG tablet Take 1 tablet (75 mg total) by mouth daily.  90 tablet  3  . diltiazem (CARDIZEM CD) 180 MG 24 hr capsule Take 1 capsule (180 mg total) by mouth daily.  90 capsule  3  . KRILL OIL PO Take by mouth daily.        . nitroGLYCERIN (NITROSTAT) 0.4 MG SL tablet Place 0.4 mg under the tongue every 5 (five) minutes as needed.         No current facility-administered medications for this visit.     Past Medical History  Diagnosis Date  . Chronic back pain   . Hyperlipidemia   . Coronary artery disease     s/p RCA PCI with a drug eluting stent  . PSVT (paroxysmal supraventricular tachycardia)     ROS:   All systems reviewed and negative except as noted in the HPI.   Past Surgical History  Procedure Laterality Date  . Coronary stent placement      mid RCA: 3.5 X 16 mm Taxus DES  . Leg surgery    . Cardiac catheterization  2008    mild nonobstructive CAD (40% proximal LAD stenosis). patent mid RCA stent     No family history on file.   History   Social  History  . Marital Status: Married    Spouse Name: N/A    Number of Children: N/A  . Years of Education: N/A   Occupational History  .  [employer]   Social History Main Topics  . Smoking status: Former Smoker    Quit date: 02/24/1998  . Smokeless tobacco: Not on file  . Alcohol Use: No  . Drug Use: No  . Sexually Active: Not on file   Other Topics Concern  . Not on file   Social History Narrative  . No narrative on file     BP 144/90  Pulse 76  Ht 6' (1.829 m)  Wt 227 lb 12.8 oz (103.329 kg)  BMI 30.89 kg/m2  Physical Exam:  Well appearing obese, middle-aged man,NAD HEENT: Unremarkable Neck:  6 cm JVD, no thyromegally Lungs:  Clear with no wheezes, rales, or rhonchi. HEART:  Regular rate rhythm, no murmurs, no rubs, no clicks Abd:  soft, positive bowel sounds, no organomegally, no rebound, no guarding Ext:  2 plus pulses, no edema, no cyanosis, no clubbing Skin:  No rashes no nodules Neuro:  CN II through XII intact, motor grossly intact  EKG - normal sinus rhythm with normal axis  and intervals, no ventricular preexcitation.  Assess/Plan:

## 2012-07-02 NOTE — Interval H&P Note (Signed)
History and Physical Interval Note:  07/02/2012 11:07 AM  Kyle [name]  has presented today for surgery, with the diagnosis of svt  The various methods of treatment have been discussed with the patient and family. After consideration of risks, benefits and other options for treatment, the patient has consented to  Procedure(s): SUPRAVENTRICULAR TACHYCARDIA ABLATION (N/A) as a surgical intervention .  The patient's history has been reviewed, patient examined, no change in status, stable for surgery.  I have reviewed the patient's chart and labs.  Questions were answered to the patient's satisfaction.     Lewayne Bunting

## 2012-07-02 NOTE — Discharge Summary (Signed)
   ELECTROPHYSIOLOGY DISCHARGE SUMMARY    Patient ID: Kyle [name],  MRN: [id number], DOB/AGE: 03/22/1960 52 y.o.  Admit date: 07/02/2012 Discharge date: 07/02/2012  Primary Care Physician: Donzetta Sprung, MD Primary Cardiologist / EP: Kirke Corin, MD / Ladona Ridgel, MD  Primary Discharge Diagnosis:  1. SVT s/p EPS + RF ablation of AVNRT   Secondary Discharge Diagnoses:  1. CAD s/p PCI to RCA 2. Dyslipidemia 3. Chronic back pain  Procedures This Admission:  1. Comprehensive EP study +RF ablation of AVNRT  Please see dictated procedure note for full details (not available at the time of this DC summary)   History and Hospital Course:  Kyle [name] is a 52 year old gentleman with CAD and dyslipidemia who has had frequent PSVT episodes despite medical therapy; therefore, he presented today for EP study +RF ablation. He was found to have inducible AVNRT which was successfully ablated. Kyle [name] tolerated this procedure well without any immediate complication. He remains hemodynamically stable and afebrile. His groin site is intact without significant bleeding or hematoma. He has been ambulating without difficulty. He has been given discharge instructions including wound care and activity restrictions. He will follow-up in clinic in 6 weeks. He has been seen, examined and deemed stable for discharge today by Dr. Lewayne Bunting.  Discharge Vitals: Pulse 72, temperature 98.3 F (36.8 C), temperature source Oral, resp. rate 94, height 6\' 10"  (2.083 m), weight 225 lb (102.059 kg).   Labs: Lab Results  Component Value Date   WBC 8.3 06/29/2012   HGB 15.7 06/29/2012   HCT 45.4 06/29/2012   MCV 86.7 06/29/2012   PLT 289.0 06/29/2012     Recent Labs Lab 06/29/12 1537  NA 137  K 3.9  CL 104  CO2 26  BUN 11  CREATININE 1.1  CALCIUM 9.2  GLUCOSE 82    Disposition:  The patient is being discharged in stable condition.  Follow-up:     Follow-up Information   Follow up with Lewayne Bunting, MD On  08/03/2012. (At 9:45 AM)    Contact information:   [address] [place] [address] [phone]     Discharge Medications:    Medication List    STOP taking these medications       diltiazem 180 MG 24 hr capsule  Commonly known as:  CARDIZEM CD      TAKE these medications       aspirin EC 81 MG tablet  Take 81 mg by mouth daily.     atorvastatin 40 MG tablet  Commonly known as:  LIPITOR  Take 1 tablet (40 mg total) by mouth at bedtime.     clopidogrel 75 MG tablet  Commonly known as:  PLAVIX  Take 1 tablet (75 mg total) by mouth daily.     KRILL OIL PO  Take 1 tablet by mouth daily.     nitroGLYCERIN 0.4 MG SL tablet  Commonly known as:  NITROSTAT  Place 0.4 mg under the tongue every 5 (five) minutes as needed for chest pain.       Duration of Discharge Encounter: Greater than 30 minutes including physician time.  Signed, Rick Duff, PA-C 07/02/2012, 1:31 PM

## 2012-07-02 NOTE — Progress Notes (Signed)
Utilization Review Completed.   Hilario Robarts, RN, BSN Nurse Case Manager  [phone]  

## 2012-07-03 NOTE — Op Note (Signed)
NAMEBLANTON, KARDELL              ACCOUNT NO.:  [id number]  MEDICAL RECORD NO.:  [id number]  LOCATION:  6531                         FACILITY:  [facility]  PHYSICIAN:  Doylene Canning. Ladona Ridgel, MD    DATE OF BIRTH:  [birth year]  DATE OF PROCEDURE:  07/02/2012 DATE OF DISCHARGE:  07/02/2012                              OPERATIVE REPORT   PROCEDURE PERFORMED:  Electrophysiologic study and RF catheter ablation of AV node reentrant tachycardia.  INTRODUCTION:  The patient is a very pleasant 52 year old man with a history of recurrent tachy palpitations associated with chest pain and shortness of breath.  He has had documented SVT at rates of over 200 beats per minute, terminated with adenosine or vagal maneuvers.  He has been on medical therapy, but has continued to have SVT and for this reason was referred for catheter ablation.  PROCEDURE:  After informed consent was obtained, the patient was taken to the diagnostic EP lab in a fasting state.  After usual preparation and draping, a 6-French hexapolar EP catheter was inserted percutaneously into the right jugular vein and advanced to the coronary sinus.  A 6-French quadripolar catheter was inserted percutaneously in the right femoral vein and advanced to the right ventricle and a 6- [place] quadripolar catheter was inserted percutaneously in the right femoral vein and advanced to the His bundle region.  After measurement of basic intervals, rapid ventricular pacing was carried out from the right ventricle and stepwise decreased down to 300 milliseconds where VA Wenckebach was observed.  During rapid ventricular pacing, the atrial activation sequence was midline and decremental.  Next, programmed ventricular stimulation was carried out from the right ventricle at a base drive cycle length of 161 milliseconds.  The S1-S2 interval stepwise decreased from 440 milliseconds down to 280 milliseconds where the retrograde AV node ERP was observed.  During  programmed ventricular stimulation, the atrial activation sequence remained midline and decremental.  Next, programmed atrial stimulation was carried out from the atrium at a base drive cycle length of 096 milliseconds.  The S1-S2 interval stepwise decreased down to 250 milliseconds where atrial refractoriness was observed.  During programmed atrial stimulation, there was AH jump in echo beats, but no inducible SVT.  Next, rapid atrial pacing was carried out from the atrium at a base drive cycle length of 045 milliseconds and stepwise decreased down to 350 milliseconds where AV Wenckebach was observed.  During rapid atrial pacing, the PR interval was equal to the RR interval, but there was no inducible SVT.  At this point, isoproterenol was infused at rates from 1 to 4 mcg per minute and additional rapid atrial pacing and programed atrial stimulation was carried out.  During rapid atrial pacing at a cycle length of 270 milliseconds, there was inducible SVT.  This was a short RP tachycardia.  Mapping demonstrated midline atrial activation.  The VA interval was essentially 0.  PVC was placed at the time of His bundle refractoriness and did not pre-excite the atrium and ventricular pacing during tachycardia demonstrated a __________ conduction sequence.  All of the above confirmed a diagnosis of AV node reentrant tachycardia.  At this point, a 7-French quadripolar ablation catheter was inserted  percutaneously into the right femoral vein and advanced under fluoroscopic guidance into the right atrium.  Mapping of Koch's triangle was carried out. This demonstrated a fairly normal-sized Koch's triangle.  A total of 6 RF energy applications were delivered to sites 6 through 9 in Koch's triangle.  During RF energy application, there was accelerated junctional rhythm.  Isoproterenol was then re-initiated and additional rapid atrial pacing was carried out over the course of the next 30 minutes.   During rapid atrial pacing, the PR interval remained equal to the RR interval, but there was no inducible SVT.  In addition, there were no AH jumps or echo beats noted as well during programmed atrial stimulation.  With all of the above, the catheters were removed, hemostasis was assured, and the patient was returned to his room in satisfactory condition.  COMPLICATIONS:  There were no immediate procedural complications.  RESULTS:  A.  Baseline ECG.  Baseline ECG demonstrates sinus rhythm with normal axis and intervals. B.  Baseline intervals.  Sinus node cycle length was 824 milliseconds. The AH interval was 69 milliseconds, the HV interval was 50 millisecond, the QRS duration was 90 milliseconds. C.  Rapid ventricular pacing.  Rapid ventricular pacing was carried out from the right ventricle demonstrating a VA Wenckebach cycle length of 300 milliseconds.  During rapid ventricular pacing, the atrial activation sequence was midline and decremental. D.  Programmed ventricular stimulation.  Programmed ventricular stimulation was carried out from the right ventricle at a base drive cycle length of 161 milliseconds and stepwise decreased down to 300 milliseconds where VA Wenckebach was observed.  During rapid ventricular pacing, the atrial activation was midline and decremental. E.  Rapid atrial pacing.  Rapid atrial pacing was carried out from the atrium at base drive cycle length of 096 milliseconds and stepwise decreased down to 350 milliseconds where AV Wenckebach was observed. During rapid atrial pacing, the PR interval was equal to, but not greater than the RR interval.  On isoproterenol, rapid atrial pacing was again carried out and resulted in the initiation of SVT. F.  Programmed atrial stimulation.  Programmed atrial stimulation was carried out from the atrium at base drive cycle length of 045 milliseconds.  This was 2 interval stepwise decreased down to 250 milliseconds where  atrial refractoriness was observed.  During programmed atrial stimulation, there were AH jumps and echo beats, but no inducible SVT. G.  Arrhythmias observed. 1. AV node reentrant tachycardia initiation was with rapid atrial     pacing on isoproterenol, the duration was sustained.  Termination     was with rapid atrial pacing.  Cycle length was 300 milliseconds.     a.     Mapping.  Mapping of Koch's triangle demonstrated a somewhat      smaller than usual Koch's triangle.     b.     RF energy application.  A total of 6 RF energy applications      were delivered to sites 6 through 9 in Koch's triangle resulting      in accelerated junctional rhythm and rendering the SVT      noninducible.  CONCLUSION:  This study demonstrates successful electrophysiologic study and RF catheter ablation of inducible AV node reentrant tachycardia with a total of 6 RF energy applications delivered to sites 6 through 9 in Koch's triangle, rendering the tachycardia noninducible.     Doylene Canning. Ladona Ridgel, MD     GWT/MEDQ  D:  07/02/2012  T:  07/03/2012  Job:  [id number]  cc:   Lorine Bears, MD

## 2012-08-03 ENCOUNTER — Ambulatory Visit: Payer: BC Managed Care – PPO | Admitting: Internal Medicine

## 2012-08-03 ENCOUNTER — Other Ambulatory Visit: Payer: Self-pay | Admitting: *Deleted

## 2012-08-03 MED ORDER — ATORVASTATIN CALCIUM 40 MG PO TABS: 40.0000 mg | ORAL_TABLET | Freq: Every day | ORAL | Status: DC

## 2012-10-05 ENCOUNTER — Encounter: Payer: Self-pay | Admitting: Internal Medicine

## 2012-10-05 ENCOUNTER — Ambulatory Visit: Payer: BC Managed Care – PPO | Admitting: Internal Medicine

## 2013-03-21 ENCOUNTER — Other Ambulatory Visit: Payer: Self-pay | Admitting: Physician Assistant

## 2013-06-24 ENCOUNTER — Other Ambulatory Visit: Payer: Self-pay | Admitting: Internal Medicine

## 2013-10-31 ENCOUNTER — Other Ambulatory Visit: Payer: Self-pay | Admitting: Internal Medicine

## 2013-11-24 ENCOUNTER — Ambulatory Visit: Payer: BC Managed Care – PPO | Admitting: Cardiovascular Disease

## 2013-11-24 ENCOUNTER — Encounter: Payer: Self-pay | Admitting: Cardiovascular Disease

## 2013-11-24 ENCOUNTER — Ambulatory Visit (INDEPENDENT_AMBULATORY_CARE_PROVIDER_SITE_OTHER): Payer: BC Managed Care – PPO | Admitting: Cardiovascular Disease

## 2013-11-24 ENCOUNTER — Encounter: Payer: Self-pay | Admitting: *Deleted

## 2013-11-24 VITALS — BP 120/83 | HR 81 | Ht 72.0 in | Wt 218.0 lb

## 2013-11-24 DIAGNOSIS — R002 Palpitations: Secondary | ICD-10-CM

## 2013-11-24 DIAGNOSIS — Z9889 Other specified postprocedural states: Secondary | ICD-10-CM

## 2013-11-24 DIAGNOSIS — Z955 Presence of coronary angioplasty implant and graft: Secondary | ICD-10-CM

## 2013-11-24 DIAGNOSIS — I25118 Atherosclerotic heart disease of native coronary artery with other forms of angina pectoris: Secondary | ICD-10-CM

## 2013-11-24 DIAGNOSIS — R079 Chest pain, unspecified: Secondary | ICD-10-CM

## 2013-11-24 MED ORDER — NITROGLYCERIN 0.4 MG SL SUBL: 0.4000 mg | SUBLINGUAL_TABLET | SUBLINGUAL | Status: DC | PRN

## 2013-11-24 MED ORDER — METOPROLOL SUCCINATE ER 25 MG PO TB24: 25.0000 mg | ORAL_TABLET | Freq: Two times a day (BID) | ORAL | Status: DC

## 2013-11-24 NOTE — Progress Notes (Signed)
Patient ID: Kyle [name], male   DOB: May 12, 1960, 53 y.o.   MRN: [id number]      SUBJECTIVE: The patient is a 53 year old male with a history of coronary artery disease and prior RCA stent placement. Most recent coronary angiogram was in January 2013 which revealed mild in-stent RCA stenosis and moderate LAD disease. He underwent radiofrequency catheter ablation for AVNRT in May 2014 by Dr. Ladona Ridgel. He has been experiencing palpitations and has been referred by Dr. Reuel Boom.  Unfortunately, his wife of over 20 yrs suddenly passed away of an MI at age 24 two weeks ago. He has three children including twins.  He is here with his father.  For the past 2-3 months, he has been experiencing palpitations with an irregular pulse with episodic "dropouts". He has palpated his carotid pulse and confirmed this. He has also been having chest tightness in the left precordium and back followed by belching with a gradual recurrence of chest tightness, and all of the aforementioned symptoms predate his wife's passing. His palpitations are most prominent after eating or after jogging. He has not gone jogging in two months. He has had some associated shortness of breath at times. He denies leg swelling and syncope. He said the chest tightness is nothing like what he experienced prior to RCA stent placement.  Dr. Reuel Boom started him on Toprol-XL 25 mg daily but as this did not alleviate his symptoms, the patient began taking one and a half tablets daily which has helped but not completely alleviated his symptoms.   Review of Systems: As per "subjective", otherwise negative.  No Known Allergies  Current Outpatient Prescriptions  Medication Sig Dispense Refill  . aspirin EC 81 MG tablet Take 81 mg by mouth daily.        Marland Kitchen atorvastatin (LIPITOR) 40 MG tablet TAKE 1 TABLET BY MOUTH AT BEDTIME  15 tablet  0  . clopidogrel (PLAVIX) 75 MG tablet TAKE 1 TABLET DAILY (NEED APPOINTMENT)  30 tablet  0  . KRILL OIL PO Take 1  tablet by mouth daily.       . metoprolol succinate (TOPROL-XL) 25 MG 24 hr tablet Take 37.5 mg by mouth daily.      . nitroGLYCERIN (NITROSTAT) 0.4 MG SL tablet Place 0.4 mg under the tongue every 5 (five) minutes as needed for chest pain.        No current facility-administered medications for this visit.    Past Medical History  Diagnosis Date  . Chronic back pain   . Hyperlipidemia   . Coronary artery disease     s/p RCA PCI with a drug eluting stent  . PSVT (paroxysmal supraventricular tachycardia)     Past Surgical History  Procedure Laterality Date  . Coronary stent placement      mid RCA: 3.5 X 16 mm Taxus DES  . Leg surgery    . Cardiac catheterization  2008    mild nonobstructive CAD (40% proximal LAD stenosis). patent mid RCA stent    History   Social History  . Marital Status: Married    Spouse Name: N/A    Number of Children: N/A  . Years of Education: N/A   Occupational History  .  [employer]   Social History Main Topics  . Smoking status: Former Smoker -- 0.75 packs/day for 15 years    Types: Cigarettes    Start date: 10/11/1983    Quit date: 02/24/1998  . Smokeless tobacco: Current User    Types: Snuff  Comment: dip occasional on weekend  . Alcohol Use: No  . Drug Use: No  . Sexual Activity: Not on file   Other Topics Concern  . Not on file   Social History Narrative  . No narrative on file     Filed Vitals:   11/24/13 1533  BP: 120/83  Pulse: 81  Height: 6' (1.829 m)  Weight: 218 lb (98.884 kg)  SpO2: 96%    PHYSICAL EXAM General: NAD HEENT: Normal. Neck: No JVD, no thyromegaly. Lungs: Clear to auscultation bilaterally with normal respiratory effort. CV: Nondisplaced PMI.  Regular rate and rhythm, normal S1/S2, no S3/S4, no murmur. No pretibial or periankle edema.  No carotid bruit.  Normal pedal pulses.  Abdomen: Soft, nontender, no hepatosplenomegaly, no distention.  Neurologic: Alert and oriented x 3.  Psych:  Normal affect. Skin: Normal. Musculoskeletal: Normal range of motion, no gross deformities. Extremities: No clubbing or cyanosis.   ECG: Most recent ECG reviewed.  Cath (January 2013): Patent RCA stent with minor instent restenosis. Moderate mid LAD stenosis without evidence of obstructive coronary artery disease.    ASSESSMENT AND PLAN: 1. Palpitations with h/o AVNRT ablation in 06/2012: Will increase Toprol-XL to 25 mg bid. Will also obtain a one week event monitor to evaluate for PAC's, PVC's, atrial fibrillation and other potential arrhythmias. 2. Chest pain in the context of CAD with RCA stent and moderate LAD disease: He has symptoms which are both typical and atypical for obstructive CAD. Given the moderate disease of his LAD seen in 02/2011, I will obtain an exercise Cardiolite stress test. I will discontinue Plavix as it has been years since stent placement. Continue ASA, beta blocker and statin.  Dispo: f/u 1 month.  Prentice DockerSuresh Brice Potteiger, M.D., F.A.C.C.

## 2013-11-24 NOTE — Patient Instructions (Signed)
   Stop Plavix   Nitroglycerin refill sent to pharm  Increase Toprol XL to 25mg  twice a day - new sent to pharm Continue all other medications.   Your physician has requested that you have en exercise stress myoview. For further information please visit [email or web address]. Please follow instruction sheet, as given. Your physician has recommended that you wear a 7 day event monitor. Event monitors are medical devices that record the heart's electrical activity. Doctors most often us these monitors to diagnose arrhythmias. Arrhythmias are problems with the speed or rhythm of the heartbeat. The monitor is a small, portable device. You can wear one while you do your normal daily activities. This is usually used to diagnose what is causing palpitations/syncope (passing out). Office will contact with results via phone or letter.   Follow up in  1 month

## 2013-11-28 ENCOUNTER — Other Ambulatory Visit: Payer: Self-pay | Admitting: *Deleted

## 2013-11-28 DIAGNOSIS — R002 Palpitations: Secondary | ICD-10-CM

## 2013-11-29 ENCOUNTER — Other Ambulatory Visit: Payer: Self-pay

## 2013-11-29 DIAGNOSIS — R072 Precordial pain: Secondary | ICD-10-CM

## 2013-12-01 ENCOUNTER — Ambulatory Visit (HOSPITAL_COMMUNITY)
Admission: RE | Admit: 2013-12-01 | Discharge: 2013-12-01 | Disposition: A | Discharge status: Home / Self Care | Payer: BC Managed Care – PPO | Source: Ambulatory Visit | Attending: Cardiovascular Disease | Admitting: Cardiovascular Disease

## 2013-12-01 ENCOUNTER — Encounter (HOSPITAL_COMMUNITY)
Admission: RE | Admit: 2013-12-01 | Discharge: 2013-12-01 | Disposition: A | Discharge status: Home / Self Care | Payer: BC Managed Care – PPO | Source: Ambulatory Visit | Attending: Cardiovascular Disease | Admitting: Cardiovascular Disease

## 2013-12-01 ENCOUNTER — Encounter (HOSPITAL_COMMUNITY): Payer: Self-pay

## 2013-12-01 ENCOUNTER — Other Ambulatory Visit: Payer: Self-pay | Admitting: *Deleted

## 2013-12-01 DIAGNOSIS — R002 Palpitations: Secondary | ICD-10-CM | POA: Diagnosis present

## 2013-12-01 DIAGNOSIS — R072 Precordial pain: Secondary | ICD-10-CM | POA: Insufficient documentation

## 2013-12-01 DIAGNOSIS — R079 Chest pain, unspecified: Secondary | ICD-10-CM

## 2013-12-01 DIAGNOSIS — R0789 Other chest pain: Secondary | ICD-10-CM | POA: Insufficient documentation

## 2013-12-01 MED ORDER — SODIUM CHLORIDE 0.9 % IJ SOLN
INTRAMUSCULAR | Status: AC
  Administered 2013-12-01: 10 mL via INTRAVENOUS
  Filled 2013-12-01: qty 10

## 2013-12-01 MED ORDER — TECHNETIUM TC 99M SESTAMIBI GENERIC - CARDIOLITE
10.0000 | Freq: Once | INTRAVENOUS | Status: AC | PRN
  Administered 2013-12-01: 10 via INTRAVENOUS

## 2013-12-01 MED ORDER — TECHNETIUM TC 99M SESTAMIBI - CARDIOLITE
30.0000 | Freq: Once | INTRAVENOUS | Status: AC | PRN
  Administered 2013-12-01: 10:00:00 30 via INTRAVENOUS

## 2013-12-01 MED ORDER — SODIUM CHLORIDE 0.9 % IJ SOLN
10.0000 mL | INTRAMUSCULAR | Status: DC | PRN
  Administered 2013-12-01: 10 mL via INTRAVENOUS

## 2013-12-01 NOTE — Progress Notes (Signed)
Stress Lab Nurses Notes - Kyle [name]  [name] Reason for doing test: Arrhythmia/Palpitations Type of test: Stress Myoview/Cardilite Nurse performing test: Enid Derryhristy Prynce Jacober RN Nuclear Medicine Tech: Lyndel Pleasureyan Liles Echo Tech: Not Applicable MD performing test: Dr Purvis SheffieldKoneswaran Family MD: Dr. Reuel Boomaniel Test explained and consent signed: Yes.   IV started: Saline lock started in radiology Symptoms: Calf burning/fatigue Treatment/Intervention: None Reason test stopped: protocol completed After recovery IV was: Discontinued via X-ray tech and No redness or edema Patient to return to Nuc. Med at : 1050 Patient discharged: Home Patient's Condition upon discharge was: stable Comments: symptoms resolved in recovery. Peak BP 147/108, HR 157, recovery BP 104/77, HR 96 Kyle [name]

## 2013-12-02 ENCOUNTER — Telehealth: Payer: Self-pay | Admitting: *Deleted

## 2013-12-02 MED ORDER — ATORVASTATIN CALCIUM 40 MG PO TABS: 40.0000 mg | ORAL_TABLET | Freq: Every evening | ORAL | Status: DC

## 2013-12-02 NOTE — Telephone Encounter (Signed)
Message copied by Lesle ChrisHILL, Ahmiya Abee G on Fri Dec 02, 2013 10:39 AM ------      Message from: Prentice DockerKONESWARAN, SURESH A      Created: Thu Dec 01, 2013  1:09 PM       Normal results. Please inform pt. ------

## 2013-12-02 NOTE — Telephone Encounter (Signed)
Notes Recorded by Lesle ChrisAngela G Cera Rorke, LPN on 11/9/147810/10/2013 at 10:39 AM Left message to return call.

## 2013-12-02 NOTE — Telephone Encounter (Signed)
Notes Recorded by Lesle ChrisAngela G Norell Brisbin, LPN on 96/0/454010/10/2013 at 5:34 PM Patient notified. Already has follow up scheduled for 12/23/2013 with Dr. Purvis SheffieldKoneswaran. ------

## 2013-12-03 DIAGNOSIS — R002 Palpitations: Secondary | ICD-10-CM

## 2013-12-23 ENCOUNTER — Encounter: Payer: Self-pay | Admitting: Cardiovascular Disease

## 2013-12-23 ENCOUNTER — Ambulatory Visit (INDEPENDENT_AMBULATORY_CARE_PROVIDER_SITE_OTHER): Payer: BC Managed Care – PPO | Admitting: Cardiovascular Disease

## 2013-12-23 VITALS — BP 129/85 | HR 77 | Ht 72.0 in | Wt 222.0 lb

## 2013-12-23 DIAGNOSIS — IMO0001 Reserved for inherently not codable concepts without codable children: Secondary | ICD-10-CM

## 2013-12-23 DIAGNOSIS — Z955 Presence of coronary angioplasty implant and graft: Secondary | ICD-10-CM

## 2013-12-23 DIAGNOSIS — I2510 Atherosclerotic heart disease of native coronary artery without angina pectoris: Secondary | ICD-10-CM

## 2013-12-23 DIAGNOSIS — K219 Gastro-esophageal reflux disease without esophagitis: Secondary | ICD-10-CM

## 2013-12-23 DIAGNOSIS — R079 Chest pain, unspecified: Secondary | ICD-10-CM

## 2013-12-23 DIAGNOSIS — I471 Supraventricular tachycardia: Secondary | ICD-10-CM

## 2013-12-23 DIAGNOSIS — R002 Palpitations: Secondary | ICD-10-CM

## 2013-12-23 DIAGNOSIS — Z136 Encounter for screening for cardiovascular disorders: Secondary | ICD-10-CM

## 2013-12-23 MED ORDER — OMEPRAZOLE 20 MG PO CPDR: 20.0000 mg | DELAYED_RELEASE_CAPSULE | Freq: Every day | ORAL | Status: DC

## 2013-12-23 NOTE — Patient Instructions (Signed)
   Begin Omeprazole 20mg  daily - new sent to pharm Continue all other medications.   Your physician wants you to follow up in: 6 months.  You will receive a reminder letter in the mail one-two months in advance.  If you don't receive a letter, please call our office to schedule the follow up appointment
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
## 2014-02-02 ENCOUNTER — Encounter (HOSPITAL_COMMUNITY): Payer: Self-pay | Admitting: Internal Medicine

## 2014-05-01 ENCOUNTER — Encounter (HOSPITAL_COMMUNITY): Payer: Self-pay

## 2014-06-21 ENCOUNTER — Other Ambulatory Visit: Payer: Self-pay | Admitting: Cardiovascular Disease

## 2014-07-10 ENCOUNTER — Other Ambulatory Visit: Payer: Self-pay | Admitting: Cardiovascular Disease

## 2014-07-10 ENCOUNTER — Encounter: Payer: Self-pay | Admitting: *Deleted

## 2014-07-10 MED ORDER — METOPROLOL SUCCINATE ER 25 MG PO TB24: 25.0000 mg | ORAL_TABLET | Freq: Two times a day (BID) | ORAL | Status: DC

## 2014-07-10 NOTE — Telephone Encounter (Signed)
Needs refill on (TOPROL-XL) 25 MG 24 hr tablet [facility]

## 2014-07-10 NOTE — Telephone Encounter (Signed)
Medication sent to pharmacy  

## 2014-07-11 ENCOUNTER — Encounter: Payer: Self-pay | Admitting: Cardiovascular Disease

## 2014-07-11 ENCOUNTER — Ambulatory Visit (INDEPENDENT_AMBULATORY_CARE_PROVIDER_SITE_OTHER): Payer: BLUE CROSS/BLUE SHIELD | Admitting: Cardiovascular Disease

## 2014-07-11 VITALS — BP 121/81 | HR 64 | Ht 72.0 in | Wt 229.0 lb

## 2014-07-11 DIAGNOSIS — E785 Hyperlipidemia, unspecified: Secondary | ICD-10-CM

## 2014-07-11 DIAGNOSIS — I2510 Atherosclerotic heart disease of native coronary artery without angina pectoris: Secondary | ICD-10-CM

## 2014-07-11 DIAGNOSIS — K219 Gastro-esophageal reflux disease without esophagitis: Secondary | ICD-10-CM | POA: Diagnosis not present

## 2014-07-11 DIAGNOSIS — I471 Supraventricular tachycardia: Secondary | ICD-10-CM

## 2014-07-11 NOTE — Progress Notes (Signed)
Patient ID: Kyle [name], male   DOB: April 04, 1960, 54 y.o.   MRN: [id number]      SUBJECTIVE: The patient returns for follow-up of coronary artery disease. He is doing very well and denies chest pain, palpitations, shortness of breath, and leg swelling. He is exercising regularly and feels well.   Review of Systems: As per "subjective", otherwise negative.  No Known Allergies  Current Outpatient Prescriptions  Medication Sig Dispense Refill  . aspirin EC 81 MG tablet Take 81 mg by mouth daily.      Marland Kitchen. atorvastatin (LIPITOR) 40 MG tablet Take 1 tablet (40 mg total) by mouth every evening. 90 tablet 3  . Coenzyme Q10 (COQ-10 PO) Take 1 capsule by mouth daily.    . Ginkgo Biloba EXTR 1 capsule by Does not apply route daily.    . metoprolol succinate (TOPROL-XL) 25 MG 24 hr tablet Take 1 tablet (25 mg total) by mouth 2 (two) times daily. 60 tablet 3  . Misc Natural Products (GNP RESVERATROL RED WINE EXT) CAPS Take 1 capsule by mouth daily.    . nitroGLYCERIN (NITROSTAT) 0.4 MG SL tablet Place 1 tablet (0.4 mg total) under the tongue every 5 (five) minutes as needed for chest pain. 25 tablet 3  . Omega 3 1000 MG CAPS Take 1 capsule by mouth daily.    Marland Kitchen. omeprazole (PRILOSEC) 20 MG capsule Take 1 capsule (20 mg total) by mouth daily. 30 capsule 6   No current facility-administered medications for this visit.    Past Medical History  Diagnosis Date  . Chronic back pain   . Hyperlipidemia   . Coronary artery disease     s/p RCA PCI with a drug eluting stent  . PSVT (paroxysmal supraventricular tachycardia)     Past Surgical History  Procedure Laterality Date  . Coronary stent placement      mid RCA: 3.5 X 16 mm Taxus DES  . Leg surgery    . Cardiac catheterization  2008    mild nonobstructive CAD (40% proximal LAD stenosis). patent mid RCA stent  . Supraventricular tachycardia ablation N/A 07/02/2012    Procedure: SUPRAVENTRICULAR TACHYCARDIA ABLATION;  Surgeon: Marinus MawGregg W Taylor, MD;   [facility];  Service: Cardiovascular;  Laterality: N/A;    History   Social History  . Marital Status: Widowed    Spouse Name: N/A  . Number of Children: N/A  . Years of Education: N/A   Occupational History  .  [employer]   Social History Main Topics  . Smoking status: Former Smoker -- 0.75 packs/day for 15 years    Types: Cigarettes    Start date: 10/11/1983    Quit date: 02/24/1998  . Smokeless tobacco: Former NeurosurgeonUser    Types: Snuff    Quit date: 10/25/2013     Comment: dip occasional on weekend  . Alcohol Use: No  . Drug Use: No  . Sexual Activity: Not on file   Other Topics Concern  . Not on file   Social History Narrative     Filed Vitals:   07/11/14 0817  BP: 121/81  Pulse: 64  Height: 6' (1.829 m)  Weight: 229 lb (103.874 kg)    PHYSICAL EXAM General: NAD HEENT: Normal. Neck: No JVD, no thyromegaly. Lungs: Clear to auscultation bilaterally with normal respiratory effort. CV: Nondisplaced PMI.  Regular rate and rhythm, normal S1/S2, no S3/S4, no murmur. No pretibial or periankle edema.  No carotid bruit.  Normal pedal pulses.  Abdomen:  Soft, nontender, no hepatosplenomegaly, no distention.  Neurologic: Alert and oriented x 3.  Psych: Normal affect. Skin: Normal. Musculoskeletal: Normal range of motion, no gross deformities. Extremities: No clubbing or cyanosis.   ECG: Most recent ECG reviewed.      ASSESSMENT AND PLAN: 1. H/o AVNRT ablation in 06/2012: Symptomatically stable with previously normal one week event monitor. Continue Toprol-XL 25 mg bid.   2. CAD with RCA stent and moderate LAD disease: Previously normal nuclear stress test and symptomatically stable. Continue ASA, beta blocker and statin.   3. GERD: Start omeprazole 20 mg daily.  4. Hyperlipidemia: Check lipids, continue statin.  Dispo: f/u 1 year.   Prentice DockerSuresh Koneswaran, M.D., F.A.C.C.

## 2014-07-11 NOTE — Patient Instructions (Signed)
   Lab for Lipids - Reminder:  Nothing to eat or drink after 12 midnight prior to labs. Office will contact with results via phone or letter.   Continue all current medications. Your physician wants you to follow up in:  1 year.  You will receive a reminder letter in the mail one-two months in advance.  If you don't receive a letter, please call our office to schedule the follow up appointment   

## 2014-09-11 ENCOUNTER — Other Ambulatory Visit: Payer: Self-pay | Admitting: Cardiovascular Disease

## 2014-11-03 ENCOUNTER — Encounter: Payer: Self-pay | Admitting: *Deleted

## 2014-11-08 ENCOUNTER — Other Ambulatory Visit: Payer: Self-pay | Admitting: Cardiovascular Disease

## 2014-12-08 ENCOUNTER — Other Ambulatory Visit: Payer: Self-pay | Admitting: Cardiovascular Disease

## 2015-05-23 ENCOUNTER — Other Ambulatory Visit: Payer: Self-pay | Admitting: *Deleted

## 2015-05-23 MED ORDER — METOPROLOL SUCCINATE ER 25 MG PO TB24: 25.0000 mg | ORAL_TABLET | Freq: Two times a day (BID) | ORAL | Status: AC

## 2015-05-23 MED ORDER — ATORVASTATIN CALCIUM 40 MG PO TABS: 40.0000 mg | ORAL_TABLET | Freq: Every evening | ORAL | Status: AC

## 2015-07-25 ENCOUNTER — Ambulatory Visit: Payer: BLUE CROSS/BLUE SHIELD | Admitting: Cardiovascular Disease

## 2015-09-13 ENCOUNTER — Inpatient Hospital Stay (HOSPITAL_COMMUNITY)
Admission: AD | Admit: 2015-09-13 | Discharge: 2015-09-16 | DRG: 247 | Disposition: A | Discharge status: Home / Self Care | Payer: Non-veteran care | Source: Other Acute Inpatient Hospital | Attending: Cardiovascular Disease | Admitting: Cardiovascular Disease

## 2015-09-13 ENCOUNTER — Encounter (HOSPITAL_COMMUNITY): Payer: Self-pay | Admitting: *Deleted

## 2015-09-13 DIAGNOSIS — Z955 Presence of coronary angioplasty implant and graft: Secondary | ICD-10-CM

## 2015-09-13 DIAGNOSIS — I200 Unstable angina: Secondary | ICD-10-CM | POA: Diagnosis not present

## 2015-09-13 DIAGNOSIS — E785 Hyperlipidemia, unspecified: Secondary | ICD-10-CM | POA: Diagnosis not present

## 2015-09-13 DIAGNOSIS — M549 Dorsalgia, unspecified: Secondary | ICD-10-CM | POA: Diagnosis present

## 2015-09-13 DIAGNOSIS — I25110 Atherosclerotic heart disease of native coronary artery with unstable angina pectoris: Secondary | ICD-10-CM | POA: Diagnosis present

## 2015-09-13 DIAGNOSIS — R079 Chest pain, unspecified: Secondary | ICD-10-CM | POA: Diagnosis present

## 2015-09-13 DIAGNOSIS — G8929 Other chronic pain: Secondary | ICD-10-CM | POA: Diagnosis present

## 2015-09-13 DIAGNOSIS — I471 Supraventricular tachycardia: Secondary | ICD-10-CM | POA: Diagnosis not present

## 2015-09-13 DIAGNOSIS — Z87891 Personal history of nicotine dependence: Secondary | ICD-10-CM

## 2015-09-13 DIAGNOSIS — Z7982 Long term (current) use of aspirin: Secondary | ICD-10-CM | POA: Diagnosis not present

## 2015-09-13 DIAGNOSIS — R072 Precordial pain: Secondary | ICD-10-CM | POA: Diagnosis not present

## 2015-09-13 LAB — CBC WITH DIFFERENTIAL/PLATELET
BASOS ABS: 0 10*3/uL (ref 0.0–0.1)
BASOS PCT: 0 %
Eosinophils Absolute: 0.2 10*3/uL (ref 0.0–0.7)
Eosinophils Relative: 3 %
HEMATOCRIT: 45 % (ref 39.0–52.0)
HEMOGLOBIN: 15 g/dL (ref 13.0–17.0)
Lymphocytes Relative: 27 %
Lymphs Abs: 2.1 10*3/uL (ref 0.7–4.0)
MCH: 30.5 pg (ref 26.0–34.0)
MCHC: 33.3 g/dL (ref 30.0–36.0)
MCV: 91.6 fL (ref 78.0–100.0)
Monocytes Absolute: 0.5 10*3/uL (ref 0.1–1.0)
Monocytes Relative: 7 %
NEUTROS ABS: 4.8 10*3/uL (ref 1.7–7.7)
NEUTROS PCT: 63 %
Platelets: 185 10*3/uL (ref 150–400)
RBC: 4.91 MIL/uL (ref 4.22–5.81)
RDW: 12.6 % (ref 11.5–15.5)
WBC: 7.6 10*3/uL (ref 4.0–10.5)

## 2015-09-13 LAB — PROTIME-INR
INR: 1.09 (ref 0.00–1.49)
PROTHROMBIN TIME: 14.3 s (ref 11.6–15.2)

## 2015-09-13 LAB — BASIC METABOLIC PANEL
ANION GAP: 7 (ref 5–15)
BUN: 8 mg/dL (ref 6–20)
CALCIUM: 8.9 mg/dL (ref 8.9–10.3)
CO2: 23 mmol/L (ref 22–32)
Chloride: 106 mmol/L (ref 101–111)
Creatinine, Ser: 0.92 mg/dL (ref 0.61–1.24)
Glucose, Bld: 190 mg/dL — ABNORMAL HIGH (ref 65–99)
Potassium: 3.5 mmol/L (ref 3.5–5.1)
SODIUM: 136 mmol/L (ref 135–145)

## 2015-09-13 LAB — TROPONIN I

## 2015-09-13 MED ORDER — ATORVASTATIN CALCIUM 40 MG PO TABS
40.0000 mg | ORAL_TABLET | Freq: Every evening | ORAL | Status: DC
  Administered 2015-09-13 – 2015-09-15 (×3): 40 mg via ORAL
  Filled 2015-09-13 (×3): qty 1

## 2015-09-13 MED ORDER — GINKGO BILOBA EXTR: 1.0000 | Freq: Every day | Status: DC

## 2015-09-13 MED ORDER — NITROGLYCERIN 2 % TD OINT
1.0000 [in_us] | TOPICAL_OINTMENT | Freq: Four times a day (QID) | TRANSDERMAL | Status: DC
  Administered 2015-09-13 – 2015-09-15 (×5): 1 [in_us] via TOPICAL
  Filled 2015-09-13 (×3): qty 30

## 2015-09-13 MED ORDER — SODIUM CHLORIDE 0.9% FLUSH: 3.0000 mL | INTRAVENOUS | Status: DC | PRN

## 2015-09-13 MED ORDER — ASPIRIN EC 81 MG PO TBEC
81.0000 mg | DELAYED_RELEASE_TABLET | Freq: Every day | ORAL | Status: DC
  Administered 2015-09-15 – 2015-09-16 (×2): 81 mg via ORAL
  Filled 2015-09-13 (×3): qty 1

## 2015-09-13 MED ORDER — SODIUM CHLORIDE 0.9% FLUSH
3.0000 mL | Freq: Two times a day (BID) | INTRAVENOUS | Status: DC
  Administered 2015-09-13 – 2015-09-14 (×2): 3 mL via INTRAVENOUS

## 2015-09-13 MED ORDER — SODIUM CHLORIDE 0.9 % WEIGHT BASED INFUSION: 1.0000 mL/kg/h | INTRAVENOUS | Status: DC

## 2015-09-13 MED ORDER — ASPIRIN 81 MG PO CHEW
81.0000 mg | CHEWABLE_TABLET | ORAL | Status: AC
  Administered 2015-09-14: 81 mg via ORAL
  Filled 2015-09-13: qty 1

## 2015-09-13 MED ORDER — SODIUM CHLORIDE 0.9 % WEIGHT BASED INFUSION
3.0000 mL/kg/h | INTRAVENOUS | Status: DC
  Administered 2015-09-14: 3 mL/kg/h via INTRAVENOUS

## 2015-09-13 MED ORDER — SODIUM CHLORIDE 0.9 % IV SOLN: 250.0000 mL | INTRAVENOUS | Status: DC | PRN

## 2015-09-13 MED ORDER — ACETAMINOPHEN 325 MG PO TABS
650.0000 mg | ORAL_TABLET | ORAL | Status: DC | PRN
  Administered 2015-09-14 – 2015-09-15 (×3): 650 mg via ORAL
  Filled 2015-09-13 (×3): qty 2

## 2015-09-13 MED ORDER — ONDANSETRON HCL 4 MG/2ML IJ SOLN
4.0000 mg | Freq: Four times a day (QID) | INTRAMUSCULAR | Status: DC | PRN
  Administered 2015-09-15: 4 mg via INTRAVENOUS
  Filled 2015-09-13 (×2): qty 2

## 2015-09-13 MED ORDER — ASPIRIN EC 81 MG PO TBEC: 81.0000 mg | DELAYED_RELEASE_TABLET | Freq: Every day | ORAL | Status: DC

## 2015-09-13 MED ORDER — HEPARIN SODIUM (PORCINE) 5000 UNIT/ML IJ SOLN
5000.0000 [IU] | Freq: Three times a day (TID) | INTRAMUSCULAR | Status: DC
  Administered 2015-09-13 – 2015-09-16 (×5): 5000 [IU] via SUBCUTANEOUS
  Filled 2015-09-13 (×5): qty 1

## 2015-09-13 MED ORDER — ASPIRIN 81 MG PO CHEW: 324.0000 mg | CHEWABLE_TABLET | ORAL | Status: AC

## 2015-09-13 MED ORDER — ASPIRIN 300 MG RE SUPP: 300.0000 mg | RECTAL | Status: AC

## 2015-09-13 MED ORDER — NITROGLYCERIN 0.4 MG SL SUBL: 0.4000 mg | SUBLINGUAL_TABLET | SUBLINGUAL | Status: DC | PRN

## 2015-09-13 MED ORDER — PANTOPRAZOLE SODIUM 40 MG PO TBEC
40.0000 mg | DELAYED_RELEASE_TABLET | Freq: Every day | ORAL | Status: DC
  Administered 2015-09-13 – 2015-09-16 (×4): 40 mg via ORAL
  Filled 2015-09-13 (×4): qty 1

## 2015-09-13 MED ORDER — METOPROLOL SUCCINATE ER 25 MG PO TB24
25.0000 mg | ORAL_TABLET | Freq: Two times a day (BID) | ORAL | Status: DC
  Administered 2015-09-13 – 2015-09-16 (×6): 25 mg via ORAL
  Filled 2015-09-13 (×6): qty 1

## 2015-09-13 MED ORDER — OMEGA-3-ACID ETHYL ESTERS 1 G PO CAPS
1.0000 g | ORAL_CAPSULE | Freq: Every day | ORAL | Status: DC
  Administered 2015-09-13 – 2015-09-16 (×3): 1 g via ORAL
  Filled 2015-09-13 (×4): qty 1

## 2015-09-13 NOTE — H&P (Signed)
Patient ID: Kyle [name] MRN: [id number], DOB/AGE: [birth year]   Admit date: 09/13/2015   Primary Physician: Donzetta Sprung, MD Primary Cardiologist: Dr. Purvis Sheffield  Pt. Profile:  Kyle [name] is a pleasant 55 yo male with PMH of PSVT s/p ablation 07/02/2012, HLD, and CAD s/p PCI to RCA in 2007 transferred from [facility] as unstable angina  Problem List  Past Medical History  Diagnosis Date  . Chronic back pain   . Hyperlipidemia   . Coronary artery disease     s/p RCA PCI with a drug eluting stent  . PSVT (paroxysmal supraventricular tachycardia)     Past Surgical History  Procedure Laterality Date  . Coronary stent placement      mid RCA: 3.5 X 16 mm Taxus DES  . Leg surgery    . Cardiac catheterization  2008    mild nonobstructive CAD (40% proximal LAD stenosis). patent mid RCA stent  . Supraventricular tachycardia ablation N/A 07/02/2012    Procedure: SUPRAVENTRICULAR TACHYCARDIA ABLATION;  Surgeon: Marinus Maw, MD;  Location: [facility] CATH LAB;  Service: Cardiovascular;  Laterality: N/A;     Allergies  No Known Allergies  HPI  Kyle [name] is a pleasant 55 yo male with PMH of PSVT s/p ablation 07/02/2012, HLD, and CAD s/p PCI to RCA in 2007. In December 2007, he underwent cardiac catheterization which noted severe stenosis in mid RCA treated with 3.5 x 16 mm Taxus Element stent. He underwent repeat cardiac catheterization in January and September 2008 which showed patent stent with nonobstructive disease. His last cardiac catheterization was on 03/19/2011 by Dr. Kirke Corin which showed 40-50% mid LAD stenosis, 50% ostial D1 stenosis, otherwise mild disease in other vessels. EF was 60%. He did have negative Myoview with normal EF in October 2015. He also had a normal 7 day event monitor in October 2015. Otherwise he has been doing well at home. He is no longer on Plavix, he continued to take low-dose aspirin on a daily basis. He has been compliant with his medications.  While sitting  down last Sunday, he had a flushing sensation and chest discomfort radiating to the left arm and also right neck. It occurred intermittently for the next few days as well. Surprisingly, patient was able to crawl under a church to fix electrical lines last Friday without any exertional symptoms. However he says he does not think he can walk on treadmill at this time for the fear of passing out from recurrent pain and his symptom has shown significant improvement after Nitro patch. He was eventually admitted to [facility] on 09/12/2015. Troponin was negative. EKG is not available. CBC shows normal white blood cell count and red blood cell count. Renal function is stable with creatinine of 0.85. Exam was concerning for unstable angina, therefore he was transferred to [facility] for [facility] evaluation.   Home Medications  Prior to Admission medications   Medication Sig Start Date End Date Taking? Authorizing Provider  aspirin EC 81 MG tablet Take 81 mg by mouth daily.      Historical Provider, MD  atorvastatin (LIPITOR) 40 MG tablet Take 1 tablet (40 mg total) by mouth every evening. 05/23/15   Laqueta Linden, MD  Coenzyme Q10 (COQ-10 PO) Take 1 capsule by mouth daily.    Historical Provider, MD  Ginkgo Biloba EXTR 1 capsule by Does not apply route daily.    Historical Provider, MD  metoprolol succinate (TOPROL-XL) 25 MG 24 hr tablet Take 1 tablet (25 mg  total) by mouth 2 (two) times daily. 05/23/15   Laqueta Linden, MD  Misc Natural Products (GNP RESVERATROL RED WINE EXT) CAPS Take 1 capsule by mouth daily.    Historical Provider, MD  nitroGLYCERIN (NITROSTAT) 0.4 MG SL tablet Place 1 tablet (0.4 mg total) under the tongue every 5 (five) minutes as needed for chest pain. 11/24/13   Laqueta Linden, MD  Omega 3 1000 MG CAPS Take 1 capsule by mouth daily.    Historical Provider, MD  omeprazole (PRILOSEC) 20 MG capsule TAKE 1 CAPSULE BY MOUTH DAILY 12/08/14   Laqueta Linden,  MD    Family History  Family History  Problem Relation Age of Onset  . Epilepsy    . AAA (abdominal aortic aneurysm)    . CAD Father     PCI  . Other Father     Carotid Artery Disease  . Other Son     bicuspid aortic valve    Social History  Social History   Social History  . Marital Status: Widowed    Spouse Name: N/A  . Number of Children: N/A  . Years of Education: N/A   Occupational History  .  [employer]   Social History Main Topics  . Smoking status: Former Smoker -- 0.75 packs/day for 15 years    Types: Cigarettes    Start date: 10/11/1983    Quit date: 02/24/1998  . Smokeless tobacco: Former Neurosurgeon    Types: Snuff    Quit date: 10/25/2013     Comment: dip occasional on weekend  . Alcohol Use: No  . Drug Use: No  . Sexual Activity: Not on file   Other Topics Concern  . Not on file   Social History Narrative     Review of Systems General:  No chills, fever, night sweats or weight changes.  Cardiovascular:  No dyspnea on exertion, edema, orthopnea, palpitations, paroxysmal nocturnal dyspnea. +chest pain, diaphoresis Dermatological: No rash, lesions/masses Respiratory: No cough, dyspnea Urologic: No hematuria, dysuria Abdominal:   No nausea, vomiting, diarrhea, bright red blood per rectum, melena, or hematemesis Neurologic:  No visual changes, wkns, changes in mental status. All other systems reviewed and are otherwise negative except as noted above.  Physical Exam  Blood pressure 139/82, pulse 67, temperature 98.2 F (36.8 C), temperature source Oral, height 6' (1.829 m), weight 208 lb 8.9 oz (94.6 kg), SpO2 98 %.  General: Pleasant, NAD Psych: Normal affect. Neuro: Alert and oriented X 3. Moves all extremities spontaneously. HEENT: Normal  Neck: Supple without bruits or JVD. Lungs:  Resp regular and unlabored, CTA. Heart: RRR no s3, s4, or murmurs. Abdomen: Soft, non-tender, non-distended, BS + x 4.  Extremities: No clubbing,  cyanosis or edema. DP/PT/Radials 2+ and equal bilaterally.  Labs  Troponin (Point of Care Test) No results for input(s): TROPIPOC in the last 72 hours. No results for input(s): CKTOTAL, CKMB, TROPONINI in the last 72 hours. Lab Results  Component Value Date   WBC 8.3 06/29/2012   HGB 15.7 06/29/2012   HCT 45.4 06/29/2012   MCV 86.7 06/29/2012   PLT 289.0 06/29/2012   No results for input(s): NA, K, CL, CO2, BUN, CREATININE, CALCIUM, PROT, BILITOT, ALKPHOS, ALT, AST, GLUCOSE in the last 168 hours.  Invalid input(s): LABALBU No results found for: CHOL, HDL, LDLCALC, TRIG No results found for: DDIMER   Radiology/Studies  No results found.  ECG  No new EKG  Echocardiogram 03/06/2011  LV EF: 75% -  80%  ------------------------------------------------------------ Indications:   Chest pain 786.51.  ------------------------------------------------------------ History:  PMH: Acquired from the patient and from the patient's chart. Palpitations. Coronary artery disease. PMH: [facility]. S/o drug eluting stent to RCA 12/07. Cath in 2008 showed stent to be patent and non-obstructive CAD.  ------------------------------------------------------------ Study Conclusions  - Left ventricle: The cavity size was normal. Wall thickness was increased in a pattern of mild LVH. Systolic function was vigorous. The estimated ejection fraction was in the range of 75% to 80%. Wall motion was normal; there were no regional wall motion abnormalities. Features are consistent with a pseudonormal left ventricular filling pattern, with concomitant abnormal relaxation and increased filling pressure (grade 2 diastolic dysfunction). - Mitral valve: Trivial regurgitation. - Tricuspid valve: Trivial regurgitation. - Pericardium, extracardiac: There was no pericardial effusion. Impressions:  - Normal left ventricular chamber size with mild LVH  and vigorous LVEF of 75-80%. Grade 2 diastolic dysfunction. Trivial mitral and tricuspid regurgitation.    ASSESSMENT AND PLAN  1. Unstable angina:  - Some typical and atypical features, however symptom relieved with Nitro patch and his history of CAD is concerning as well. I have discussed with M.D., plan for cardiac catheterization tomorrow.  - Risk and benefit of procedure explained to the patient who display clear understanding and agree to proceed. Discussed with patient possible procedural risk include bleeding, vascular injury, renal injury, arrythmia, MI, stroke and loss of limb or life.  2. CAD s/p 3.5 x 16 mm Taxus Element stent to mid RCA in 01/2006  - last cardiac catheterization 03/19/2011 by Dr. Kirke CorinArida showed 40-50% mid LAD stenosis, 50% ostial D1 stenosis, otherwise mild disease in other vessels. EF was 60%  3. PSVT s/p ablation 07/02/2012  4. HLD: continue lipitor   Signed, Azalee CourseHao Meng, PA-C 09/13/2015, 5:14 PM   History and all data above reviewed.  Patient examined.  I agree with the findings as above.  The patient presents with chest pain on and off since Sunday.  It was particularly severe yesterday.  He describes substernal pain and some discomfort in his jaw on the right side, and left arm.   [facility] and says that his pain has been relieved after NTG paste.  I [facility].  However, enzymes were negative.  The patient exam reveals COR:RRR  ,  Lungs: Clear  ,  Abd: Positive bowel sounds, no rebound no guarding, Ext No edema  .  All available labs, radiology testing, previous records reviewed. Agree with documented assessment and plan.   Chest pain:  Suspicious for unstable angina.  Cardiac cath is indicated.  The patient understands that risks included but are not limited to stroke (1 in 1000), death (1 in 1000), kidney failure [usually temporary] (1 in 500), bleeding (1 in 200), allergic reaction [possibly serious] (1 in 200).  The  patient understands and agrees to proceed.   Continue NTG paste.    Fayrene FearingJames Lysle Yero  6:25 PM  09/13/2015

## 2015-09-14 ENCOUNTER — Encounter (HOSPITAL_COMMUNITY)
Admission: AD | Disposition: A | Discharge status: Home / Self Care | Payer: Self-pay | Source: Other Acute Inpatient Hospital | Attending: Cardiovascular Disease

## 2015-09-14 DIAGNOSIS — I200 Unstable angina: Secondary | ICD-10-CM | POA: Diagnosis not present

## 2015-09-14 DIAGNOSIS — I25110 Atherosclerotic heart disease of native coronary artery with unstable angina pectoris: Secondary | ICD-10-CM | POA: Diagnosis present

## 2015-09-14 DIAGNOSIS — M549 Dorsalgia, unspecified: Secondary | ICD-10-CM | POA: Diagnosis present

## 2015-09-14 DIAGNOSIS — I471 Supraventricular tachycardia: Secondary | ICD-10-CM | POA: Diagnosis not present

## 2015-09-14 DIAGNOSIS — R072 Precordial pain: Secondary | ICD-10-CM | POA: Diagnosis not present

## 2015-09-14 DIAGNOSIS — G8929 Other chronic pain: Secondary | ICD-10-CM | POA: Diagnosis present

## 2015-09-14 DIAGNOSIS — E785 Hyperlipidemia, unspecified: Secondary | ICD-10-CM | POA: Diagnosis present

## 2015-09-14 DIAGNOSIS — Z955 Presence of coronary angioplasty implant and graft: Secondary | ICD-10-CM | POA: Diagnosis not present

## 2015-09-14 DIAGNOSIS — R079 Chest pain, unspecified: Secondary | ICD-10-CM | POA: Diagnosis present

## 2015-09-14 DIAGNOSIS — Z87891 Personal history of nicotine dependence: Secondary | ICD-10-CM | POA: Diagnosis not present

## 2015-09-14 DIAGNOSIS — Z7982 Long term (current) use of aspirin: Secondary | ICD-10-CM | POA: Diagnosis not present

## 2015-09-14 HISTORY — PX: CARDIAC CATHETERIZATION: SHX172

## 2015-09-14 LAB — LIPID PANEL
Cholesterol: 117 mg/dL (ref 0–200)
HDL: 47 mg/dL (ref >40)
LDL Cholesterol: 51 mg/dL (ref 0–99)
Total CHOL/HDL Ratio: 2.5 ratio
Triglycerides: 97 mg/dL (ref <150)
VLDL: 19 mg/dL (ref 0–40)

## 2015-09-14 LAB — BASIC METABOLIC PANEL WITH GFR
Anion gap: 6 (ref 5–15)
BUN: 9 mg/dL (ref 6–20)
CO2: 25 mmol/L (ref 22–32)
Calcium: 9.2 mg/dL (ref 8.9–10.3)
Chloride: 108 mmol/L (ref 101–111)
Creatinine, Ser: 0.89 mg/dL (ref 0.61–1.24)
GFR calc Af Amer: >60 mL/min (ref >60)
GFR calc non Af Amer: >60 mL/min (ref >60)
Glucose, Bld: 91 mg/dL (ref 65–99)
Potassium: 3.8 mmol/L (ref 3.5–5.1)
Sodium: 139 mmol/L (ref 135–145)

## 2015-09-14 LAB — POCT ACTIVATED CLOTTING TIME: ACTIVATED CLOTTING TIME: 466 s

## 2015-09-14 LAB — TROPONIN I
Troponin I: <0.03 ng/mL (ref <0.03)
Troponin I: <0.03 ng/mL (ref <0.03)

## 2015-09-14 SURGERY — LEFT HEART CATH AND CORONARY ANGIOGRAPHY
Anesthesia: LOCAL

## 2015-09-14 MED ORDER — LIDOCAINE HCL (PF) 1 % IJ SOLN
INTRAMUSCULAR | Status: DC | PRN
  Administered 2015-09-14: 3 mL

## 2015-09-14 MED ORDER — TICAGRELOR 90 MG PO TABS
ORAL_TABLET | ORAL | Status: DC | PRN
  Administered 2015-09-14: 180 mg via ORAL

## 2015-09-14 MED ORDER — IOPAMIDOL (ISOVUE-370) INJECTION 76%
INTRAVENOUS | Status: AC
  Filled 2015-09-14: qty 100

## 2015-09-14 MED ORDER — MIDAZOLAM HCL 2 MG/2ML IJ SOLN
INTRAMUSCULAR | Status: AC
  Filled 2015-09-14: qty 2

## 2015-09-14 MED ORDER — SODIUM CHLORIDE 0.9 % WEIGHT BASED INFUSION
3.0000 mL/kg/h | INTRAVENOUS | Status: AC
Start: 2015-09-14 — End: 2015-09-14
  Administered 2015-09-14: 18:00:00 3 mL/kg/h via INTRAVENOUS

## 2015-09-14 MED ORDER — HEPARIN (PORCINE) IN NACL 2-0.9 UNIT/ML-% IJ SOLN
INTRAMUSCULAR | Status: AC
  Filled 2015-09-14: qty 1000

## 2015-09-14 MED ORDER — HEPARIN SODIUM (PORCINE) 1000 UNIT/ML IJ SOLN
INTRAMUSCULAR | Status: AC
  Filled 2015-09-14: qty 1

## 2015-09-14 MED ORDER — IOPAMIDOL (ISOVUE-370) INJECTION 76%
INTRAVENOUS | Status: DC | PRN
  Administered 2015-09-14: 170 mL

## 2015-09-14 MED ORDER — HEPARIN (PORCINE) IN NACL 2-0.9 UNIT/ML-% IJ SOLN
INTRAMUSCULAR | Status: DC | PRN
  Administered 2015-09-14: 1500 mL

## 2015-09-14 MED ORDER — LIDOCAINE HCL (PF) 1 % IJ SOLN
INTRAMUSCULAR | Status: AC
  Filled 2015-09-14: qty 30

## 2015-09-14 MED ORDER — TICAGRELOR 90 MG PO TABS
ORAL_TABLET | ORAL | Status: AC
  Filled 2015-09-14: qty 1

## 2015-09-14 MED ORDER — SODIUM CHLORIDE 0.9% FLUSH
3.0000 mL | Freq: Two times a day (BID) | INTRAVENOUS | Status: DC
  Administered 2015-09-15: 3 mL via INTRAVENOUS

## 2015-09-14 MED ORDER — TICAGRELOR 90 MG PO TABS
90.0000 mg | ORAL_TABLET | Freq: Two times a day (BID) | ORAL | Status: DC
  Administered 2015-09-15 – 2015-09-16 (×4): 90 mg via ORAL
  Filled 2015-09-14 (×4): qty 1

## 2015-09-14 MED ORDER — SODIUM CHLORIDE 0.9 % IV SOLN
250.0000 mg | INTRAVENOUS | Status: DC | PRN
  Administered 2015-09-14: 1.75 mg/kg/h via INTRAVENOUS

## 2015-09-14 MED ORDER — FENTANYL CITRATE (PF) 100 MCG/2ML IJ SOLN
INTRAMUSCULAR | Status: DC | PRN
  Administered 2015-09-14 (×2): 50 ug via INTRAVENOUS

## 2015-09-14 MED ORDER — MORPHINE SULFATE (PF) 2 MG/ML IV SOLN
2.0000 mg | INTRAVENOUS | Status: DC | PRN
  Administered 2015-09-15: 2 mg via INTRAVENOUS
  Filled 2015-09-14: qty 1

## 2015-09-14 MED ORDER — SODIUM CHLORIDE 0.9% FLUSH: 3.0000 mL | INTRAVENOUS | Status: DC | PRN

## 2015-09-14 MED ORDER — SODIUM CHLORIDE 0.9 % IV SOLN: 250.0000 mL | INTRAVENOUS | Status: DC | PRN

## 2015-09-14 MED ORDER — FENTANYL CITRATE (PF) 100 MCG/2ML IJ SOLN
INTRAMUSCULAR | Status: AC
  Filled 2015-09-14: qty 2

## 2015-09-14 MED ORDER — VERAPAMIL HCL 2.5 MG/ML IV SOLN
INTRAVENOUS | Status: AC
  Filled 2015-09-14: qty 2

## 2015-09-14 MED ORDER — MIDAZOLAM HCL 2 MG/2ML IJ SOLN
INTRAMUSCULAR | Status: DC | PRN
  Administered 2015-09-14: 2 mg via INTRAVENOUS
  Administered 2015-09-14: 1 mg via INTRAVENOUS

## 2015-09-14 MED ORDER — HEPARIN SODIUM (PORCINE) 1000 UNIT/ML IJ SOLN
INTRAMUSCULAR | Status: DC | PRN
  Administered 2015-09-14: 4500 [IU] via INTRAVENOUS

## 2015-09-14 MED ORDER — BIVALIRUDIN BOLUS VIA INFUSION - CUPID
INTRAVENOUS | Status: DC | PRN
  Administered 2015-09-14: 70.95 mg via INTRAVENOUS

## 2015-09-14 MED ORDER — BIVALIRUDIN 250 MG IV SOLR
INTRAVENOUS | Status: AC
  Filled 2015-09-14: qty 250

## 2015-09-14 MED ORDER — VERAPAMIL HCL 2.5 MG/ML IV SOLN
INTRAVENOUS | Status: DC | PRN
  Administered 2015-09-14: 10 mL via INTRA_ARTERIAL

## 2015-09-14 SURGICAL SUPPLY — 16 items
BALLN EMERGE MR 2.5X12 (BALLOONS) ×2
BALLOON EMERGE MR 2.5X12 (BALLOONS) IMPLANT
CATH OPTITORQUE TIG 4.0 5F (CATHETERS) ×1 IMPLANT
CATH VISTA GUIDE 6FR XBLAD3.5 (CATHETERS) ×1 IMPLANT
DEVICE RAD COMP TR BAND LRG (VASCULAR PRODUCTS) ×1 IMPLANT
GLIDESHEATH SLEND A-KIT 6F 22G (SHEATH) ×1 IMPLANT
GUIDELINER 6F (CATHETERS) ×1 IMPLANT
KIT ENCORE 26 ADVANTAGE (KITS) ×2 IMPLANT
KIT HEART LEFT (KITS) ×2 IMPLANT
PACK CARDIAC CATHETERIZATION (CUSTOM PROCEDURE TRAY) ×2 IMPLANT
STENT RESOLUTE INTEG 2.75X12 (Permanent Stent) ×1 IMPLANT
TRANSDUCER W/STOPCOCK (MISCELLANEOUS) ×2 IMPLANT
TUBING CIL FLEX 10 FLL-RA (TUBING) ×2 IMPLANT
WIRE ASAHI PROWATER 180CM (WIRE) ×1 IMPLANT
WIRE COUGAR XT STRL 190CM (WIRE) ×1 IMPLANT
WIRE SAFE-T 1.5MM-J .035X260CM (WIRE) ×1 IMPLANT

## 2015-09-14 NOTE — Progress Notes (Signed)
Patient Name: Kyle [name] Date of Encounter: 09/14/2015  Primary Cardiologist: Dr. Purvis Sheffield @ [facility] Profile:  Kyle [name] is a pleasant 55 yo male with PMH of PSVT s/p ablation 07/02/2012, HLD, and CAD s/p PCI to RCA in 2007 transferred from [facility] as unstable angina  SUBJECTIVE  For cath today. Still have mild chest discomfort. No SOB.   CURRENT MEDS . aspirin  324 mg Oral NOW   Or  . aspirin  300 mg Rectal NOW  . aspirin EC  81 mg Oral Daily  . atorvastatin  40 mg Oral QPM  . heparin  5,000 Units Subcutaneous Q8H  . metoprolol succinate  25 mg Oral BID  . nitroGLYCERIN  1 inch Topical Q6H  . omega-3 acid ethyl esters  1 g Oral Daily  . pantoprazole  40 mg Oral Daily  . sodium chloride flush  3 mL Intravenous Q12H    OBJECTIVE  Filed Vitals:   09/13/15 1649 09/13/15 1950 09/14/15 0500  BP: 139/82 116/69 110/66  Pulse: 67 69 96  Temp: 98.2 F (36.8 C) 97.9 F (36.6 C) 97.8 F (36.6 C)  TempSrc: Oral Oral Oral  Resp:  18 18  Height: 6' (1.829 m)    Weight: 208 lb 8.9 oz (94.6 kg)    SpO2: 98% 99% 98%    Intake/Output Summary (Last 24 hours) at 09/14/15 0937 Last data filed at 09/14/15 0745  Gross per 24 hour  Intake    720 ml  Output      0 ml  Net    720 ml   Filed Weights   09/13/15 1649  Weight: 208 lb 8.9 oz (94.6 kg)    PHYSICAL EXAM  General: Pleasant, NAD. Neuro: Alert and oriented X 3. Moves all extremities spontaneously. Psych: Normal affect. HEENT:  Normal  Neck: Supple without bruits or JVD. Lungs:  Resp regular and unlabored, CTA. Heart: RRR no s3, s4, or murmurs. Abdomen: Soft, non-tender, non-distended, BS + x 4.  Extremities: No clubbing, cyanosis or edema. DP/PT/Radials 2+ and equal bilaterally.  Accessory Clinical Findings  CBC  Recent Labs  09/13/15 2033  WBC 7.6  NEUTROABS 4.8  HGB 15.0  HCT 45.0  MCV 91.6  PLT 185   Basic Metabolic Panel  Recent Labs  09/13/15 2033 09/14/15 0359  NA 136 139  K 3.5  3.8  CL 106 108  CO2 23 25  GLUCOSE 190* 91  BUN 8 9  CREATININE 0.92 0.89  CALCIUM 8.9 9.2   Liver Function Tests No results for input(s): AST, ALT, ALKPHOS, BILITOT, PROT, ALBUMIN in the last 72 hours. No results for input(s): LIPASE, AMYLASE in the last 72 hours. Cardiac Enzymes  Recent Labs  09/13/15 2033 09/13/15 2320 09/14/15 0359  TROPONINI <0.03 <0.03 <0.03   BNP Invalid input(s): POCBNP D-Dimer No results for input(s): DDIMER in the last 72 hours. Hemoglobin A1C No results for input(s): HGBA1C in the last 72 hours. Fasting Lipid Panel  Recent Labs  09/14/15 0359  CHOL 117  HDL 47  LDLCALC 51  TRIG 97  CHOLHDL 2.5   Thyroid Function Tests No results for input(s): TSH, T4TOTAL, T3FREE, THYROIDAB in the last 72 hours.  Invalid input(s): FREET3  TELE  Sinus rhythm   Radiology/Studies  No results found.  ASSESSMENT AND PLAN   1. Unstable angina - Symptom improved on nitro patch. Troponin x 3 negative. Lytes and kidney function normal. For cath later today.   2. CAD s/p 3.5 x 16  mm Taxus Element stent to mid RCA in 01/2006 - last cardiac catheterization 03/19/2011 by Dr. Kirke CorinArida showed 40-50% mid LAD stenosis, 50% ostial D1 stenosis, otherwise mild disease in other vessels. EF was 60%  3. PSVT s/p ablation 07/02/2012  4. HLD - 09/14/2015: Cholesterol 117; HDL 47; LDL Cholesterol 51; Triglycerides 97; VLDL 19  - Continue lipitor  Signed, Bhagat,Bhavinkumar PA-C Pager [phone]  History and all data above reviewed.  Patient examined.  I agree with the findings as above.  He has continued to have some chest and neck pain without evidence of ischemia.    The patient exam reveals COR:RRR, no rub  ,  Lungs: Clear  ,  Abd: Positive bowel sounds, no rebound no guarding, Ext No edema  .  All available labs, radiology testing, previous records reviewed. Agree with documented assessment and plan. CHEST PAIN:  Possible unstable angina.  Cath today.  OK to  discharge home if no obstructive CAD.   PSVT:  Tele reviewed.  No evidence of arrhythmia.    Fayrene FearingJames Kemonte Ullman  10:26 AM  09/14/2015

## 2015-09-14 NOTE — H&P (View-Only) (Signed)
 Patient Name: Kyle [name] Date of Encounter: 09/14/2015  Primary Cardiologist: Dr. Koneswaran @ [facility]. Profile:  Mr. Kyle [name] is a pleasant 55 yo male with PMH of PSVT s/p ablation 07/02/2012, HLD, and CAD s/p PCI to RCA in 2007 transferred from [facility] as unstable angina  SUBJECTIVE  For cath today. Still have mild chest discomfort. No SOB.   CURRENT MEDS . aspirin  324 mg Oral NOW   Or  . aspirin  300 mg Rectal NOW  . aspirin EC  81 mg Oral Daily  . atorvastatin  40 mg Oral QPM  . heparin  5,000 Units Subcutaneous Q8H  . metoprolol succinate  25 mg Oral BID  . nitroGLYCERIN  1 inch Topical Q6H  . omega-3 acid ethyl esters  1 g Oral Daily  . pantoprazole  40 mg Oral Daily  . sodium chloride flush  3 mL Intravenous Q12H    OBJECTIVE  Filed Vitals:   09/13/15 1649 09/13/15 1950 09/14/15 0500  BP: 139/82 116/69 110/66  Pulse: 67 69 96  Temp: 98.2 F (36.8 C) 97.9 F (36.6 C) 97.8 F (36.6 C)  TempSrc: Oral Oral Oral  Resp:  18 18  Height: 6' (1.829 m)    Weight: 208 lb 8.9 oz (94.6 kg)    SpO2: 98% 99% 98%    Intake/Output Summary (Last 24 hours) at 09/14/15 0937 Last data filed at 09/14/15 0745  Gross per 24 hour  Intake    720 ml  Output      0 ml  Net    720 ml   Filed Weights   09/13/15 1649  Weight: 208 lb 8.9 oz (94.6 kg)    PHYSICAL EXAM  General: Pleasant, NAD. Neuro: Alert and oriented X 3. Moves all extremities spontaneously. Psych: Normal affect. HEENT:  Normal  Neck: Supple without bruits or JVD. Lungs:  Resp regular and unlabored, CTA. Heart: RRR no s3, s4, or murmurs. Abdomen: Soft, non-tender, non-distended, BS + x 4.  Extremities: No clubbing, cyanosis or edema. DP/PT/Radials 2+ and equal bilaterally.  Accessory Clinical Findings  CBC  Recent Labs  09/13/15 2033  WBC 7.6  NEUTROABS 4.8  HGB 15.0  HCT 45.0  MCV 91.6  PLT 185   Basic Metabolic Panel  Recent Labs  09/13/15 2033 09/14/15 0359  NA 136 139  K 3.5  3.8  CL 106 108  CO2 23 25  GLUCOSE 190* 91  BUN 8 9  CREATININE 0.92 0.89  CALCIUM 8.9 9.2   Liver Function Tests No results for input(s): AST, ALT, ALKPHOS, BILITOT, PROT, ALBUMIN in the last 72 hours. No results for input(s): LIPASE, AMYLASE in the last 72 hours. Cardiac Enzymes  Recent Labs  09/13/15 2033 09/13/15 2320 09/14/15 0359  TROPONINI <0.03 <0.03 <0.03   BNP Invalid input(s): POCBNP D-Dimer No results for input(s): DDIMER in the last 72 hours. Hemoglobin A1C No results for input(s): HGBA1C in the last 72 hours. Fasting Lipid Panel  Recent Labs  09/14/15 0359  CHOL 117  HDL 47  LDLCALC 51  TRIG 97  CHOLHDL 2.5   Thyroid Function Tests No results for input(s): TSH, T4TOTAL, T3FREE, THYROIDAB in the last 72 hours.  Invalid input(s): FREET3  TELE  Sinus rhythm   Radiology/Studies  No results found.  ASSESSMENT AND PLAN   1. Unstable angina - Symptom improved on nitro patch. Troponin x 3 negative. Lytes and kidney function normal. For cath later today.   2. CAD s/p 3.5 x 16   mm Taxus Element stent to mid RCA in 01/2006 - last cardiac catheterization 03/19/2011 by Dr. Kirke CorinArida showed 40-50% mid LAD stenosis, 50% ostial D1 stenosis, otherwise mild disease in other vessels. EF was 60%  3. PSVT s/p ablation 07/02/2012  4. HLD - 09/14/2015: Cholesterol 117; HDL 47; LDL Cholesterol 51; Triglycerides 97; VLDL 19  - Continue lipitor  Signed, Bhagat,Bhavinkumar PA-C Pager [phone]  History and all data above reviewed.  Patient examined.  I agree with the findings as above.  He has continued to have some chest and neck pain without evidence of ischemia.    The patient exam reveals COR:RRR, no rub  ,  Lungs: Clear  ,  Abd: Positive bowel sounds, no rebound no guarding, Ext No edema  .  All available labs, radiology testing, previous records reviewed. Agree with documented assessment and plan. CHEST PAIN:  Possible unstable angina.  Cath today.  OK to  discharge home if no obstructive CAD.   PSVT:  Tele reviewed.  No evidence of arrhythmia.    Kyle [name]  10:26 AM  09/14/2015

## 2015-09-14 NOTE — Interval H&P Note (Signed)
History and Physical Interval Note:  09/14/2015 3:42 PM  Kyle [name]  has presented today for surgery, with the diagnosis of unstable angina  The various methods of treatment have been discussed with the patient and family. After consideration of risks, benefits and other options for treatment, the patient has consented to  Procedure(s): Left Heart Cath and Coronary Angiography (N/A) With Possible PCI as a surgical intervention .  The patient's history has been reviewed, patient examined, no change in status, stable for surgery.  I have reviewed the patient's chart and labs.  Questions were answered to the patient's satisfaction.    Cath Lab Visit (complete for each Cath Lab visit)  Clinical Evaluation Leading to the Procedure:   ACS: Yes.    Non-ACS:    Anginal Classification: CCS IV  Anti-ischemic medical therapy: Minimal Therapy (1 class of medications)  Non-Invasive Test Results: No non-invasive testing performed  Prior CABG: No previous CABG  TIMI SCORE  Patient Information:  TIMI Score is 3  UA/NSTEMI and intermediate-risk features (e.g., TIMI score 3?4) for short-term risk of death or nonfatal MI  Revascularization of the presumed culprit artery   A (9)  Indication: 10; Score: 9   Bryan Lemmaavid Harding

## 2015-09-15 DIAGNOSIS — R079 Chest pain, unspecified: Secondary | ICD-10-CM

## 2015-09-15 DIAGNOSIS — I25110 Atherosclerotic heart disease of native coronary artery with unstable angina pectoris: Secondary | ICD-10-CM | POA: Diagnosis not present

## 2015-09-15 LAB — BASIC METABOLIC PANEL
Anion gap: 6 (ref 5–15)
BUN: 8 mg/dL (ref 6–20)
CO2: 22 mmol/L (ref 22–32)
Calcium: 8.9 mg/dL (ref 8.9–10.3)
Chloride: 109 mmol/L (ref 101–111)
Creatinine, Ser: 0.81 mg/dL (ref 0.61–1.24)
GFR calc Af Amer: >60 mL/min (ref >60)
GFR calc non Af Amer: >60 mL/min (ref >60)
Glucose, Bld: 86 mg/dL (ref 65–99)
Potassium: 3.7 mmol/L (ref 3.5–5.1)
Sodium: 137 mmol/L (ref 135–145)

## 2015-09-15 LAB — CBC
HCT: 43.7 % (ref 39.0–52.0)
Hemoglobin: 14.4 g/dL (ref 13.0–17.0)
MCH: 30 pg (ref 26.0–34.0)
MCHC: 33 g/dL (ref 30.0–36.0)
MCV: 91 fL (ref 78.0–100.0)
Platelets: 183 10*3/uL (ref 150–400)
RBC: 4.8 MIL/uL (ref 4.22–5.81)
RDW: 12.8 % (ref 11.5–15.5)
WBC: 9.8 10*3/uL (ref 4.0–10.5)

## 2015-09-15 LAB — GLUCOSE, CAPILLARY: GLUCOSE-CAPILLARY: 94 mg/dL (ref 65–99)

## 2015-09-15 MED ORDER — ONDANSETRON HCL 4 MG/2ML IJ SOLN
4.0000 mg | Freq: Every day | INTRAMUSCULAR | Status: DC
  Administered 2015-09-15: 4 mg via INTRAVENOUS

## 2015-09-15 MED ORDER — ANGIOPLASTY BOOK
Freq: Once | Status: AC
  Administered 2015-09-15: 02:00:00
  Filled 2015-09-15: qty 1

## 2015-09-15 MED ORDER — ONDANSETRON HCL 4 MG/2ML IJ SOLN: 4.0000 mg | INTRAMUSCULAR | Status: DC | PRN

## 2015-09-15 MED ORDER — SODIUM CHLORIDE 0.9 % IV SOLN
Freq: Once | INTRAVENOUS | Status: AC
  Administered 2015-09-15: 10:00:00 500 mL via INTRAVENOUS

## 2015-09-15 NOTE — Progress Notes (Signed)
Pt had c/o of 8/10 headache and is nauseous. Brittney Simons PA-C called and updated. CBG = 94. New order to give Zofran 4 mg and change in frequency to Q 4 as needed. Medication given . Pt indicated he feels a lot better.

## 2015-09-15 NOTE — Progress Notes (Addendum)
Pt indicating his headache is better but is still nauseated. BP 120/76, HR = 72. Will continue to monitor. Will update MD accordingly.

## 2015-09-15 NOTE — Progress Notes (Addendum)
CARDIAC REHAB PHASE I   PRE:  Rate/Rhythm: 73 SR c elevated T wave   BP:   Sitting: 133/94     SaO2: 98% RA  MODE:  Ambulation: 450 ft   POST:  Rate/Rhythm: 70 SR c elevated T wave  BP:   Sitting: 144/100 rchk BP 134/79     SaO2: 98 % RA  819-922 Pt ambulated 431ft with min A/ gait belt. Pt c/o mild anterior shoulder pain 2/10 and feeling cloudy. Returned to room. Sat pt down on bed in which pt c/o of mild jaw pain 1/10. DBP was elevated 144/100. Pt started to get lightheaded and dizzy. Reclined pt and BP went to 134/79. Referred to P.A. Simmons. A plan was discussed. Educated the pt and family member (mother) on PCI/stent, antiplatelets, risk factors, restrictions, exercise guidelines, NTG use and emergency/temperature precautions, stress management and HH diet. Pt has stent card. Pt verbalize understanding. Referred pt to cardiac rehab at [facility]  Kyle [name] D Kyle Royce,MS,ACSM-RCEP 09/15/2015 9:22 AM

## 2015-09-15 NOTE — Progress Notes (Signed)
Ambulated pt per PA  Instruction. Initial BP 100/58,HR = 69; O2 sat 98%. Post ambulation BP 125/77, HR = 69, = 97%. No further c/o nausea, slight c/o dizziness on ambulation. No c/o of chest pain nor SOB on exertion.

## 2015-09-15 NOTE — Progress Notes (Signed)
Patient Profile: 55 yo male with PMH of PSVT s/p ablation 07/02/2012, HLD, and CAD s/p PCI to RCA in 2007 transferred from [facility] for unstable angina. He ruled out for NSTEMI with negative enzymes x 3. Found to have an 85% mid LAD lesion, successful treated with PCI + DES. Normal LVEF of 55-60%..   Subjective: Doing well. No recurrent CP. No dyspnea. Right radial cath site is stable. No complications.   Objective: Vital signs in last 24 hours: Temp:  [97.7 F (36.5 C)-98.7 F (37.1 C)] 98.7 F (37.1 C) (07/22 0722) Pulse Rate:  [57-78] 64 (07/22 0722) Resp:  [11-22] 12 (07/22 0722) BP: (101-148)/(64-91) 116/74 mmHg (07/22 0722) SpO2:  [93 %-100 %] 96 % (07/22 0722) Weight:  [214 lb 1.1 oz (97.1 kg)] 214 lb 1.1 oz (97.1 kg) (07/22 0417) Last BM Date: 09/14/15  Intake/Output from previous day: 07/21 0701 - 07/22 0700 In: 932.9 [P.O.:720; I.V.:212.9] Out: 1775 [Urine:1775] Intake/Output this shift:    Medications Current Facility-Administered Medications  Medication Dose Route Frequency Provider Last Rate Last Dose  . 0.9 %  sodium chloride infusion  250 mL Intravenous PRN Marykay Lex, MD      . acetaminophen (TYLENOL) tablet 650 mg  650 mg Oral Q4H PRN Azalee Course, PA   650 mg at 09/14/15 2034  . aspirin EC tablet 81 mg  81 mg Oral Daily Azalee Course, [place]   0 mg at 09/14/15 1043  . atorvastatin (LIPITOR) tablet 40 mg  40 mg Oral QPM Azalee Course, PA   40 mg at 09/14/15 1820  . heparin injection 5,000 Units  5,000 Units Subcutaneous Q8H Azalee Course, [place]   5,000 Units at 09/14/15 2114  . metoprolol succinate (TOPROL-XL) 24 hr tablet 25 mg  25 mg Oral BID Azalee Course, PA   25 mg at 09/14/15 2114  . morphine 2 MG/ML injection 2 mg  2 mg Intravenous Q1H PRN Marykay Lex, MD      . nitroGLYCERIN (NITROGLYN) 2 % ointment 1 inch  1 inch Topical Q6H Rollene Rotunda, MD   1 inch at 09/14/15 1143  . nitroGLYCERIN (NITROSTAT) SL tablet 0.4 mg  0.4 mg Sublingual Q5 Min x 3 PRN Azalee Course, PA      .  omega-3 acid ethyl esters (LOVAZA) capsule 1 g  1 g Oral Daily Azalee Course, [place]   1 g at 09/13/15 2116  . ondansetron (ZOFRAN) injection 4 mg  4 mg Intravenous Q6H PRN Azalee Course, PA      . pantoprazole (PROTONIX) EC tablet 40 mg  40 mg Oral Daily Azalee Course, [place]   40 mg at 09/14/15 1042  . sodium chloride flush (NS) 0.9 % injection 3 mL  3 mL Intravenous Q12H Marykay Lex, MD   3 mL at 09/14/15 2115  . sodium chloride flush (NS) 0.9 % injection 3 mL  3 mL Intravenous PRN Marykay Lex, MD      . ticagrelor [facility]) tablet 90 mg  90 mg Oral BID Marykay Lex, MD   90 mg at 09/15/15 0500    PE: General appearance: alert, cooperative and no distress Neck: no carotid bruit and no JVD Lungs: clear to auscultation bilaterally Heart: regular rate and rhythm, S1, S2 normal, no murmur, click, rub or gallop Extremities: no LEE Pulses: 2+ and symmetric Skin: warm and dry Neurologic: Grossly normal  Lab Results:   Recent Labs  09/13/15 2033 09/15/15 0528  WBC 7.6 9.8  HGB  15.0 14.4  HCT 45.0 43.7  PLT 185 183   BMET  Recent Labs  09/13/15 2033 09/14/15 0359 09/15/15 0528  NA 136 139 137  K 3.5 3.8 3.7  CL 106 108 109  CO2 GLUCOSE 190* 91 86  BUN CREATININE 0.92 0.89 0.81  CALCIUM 8.9 9.2 8.9   PT/INR  Recent Labs  09/13/15 2033  LABPROT 14.3  INR 1.09   Cholesterol  Recent Labs  09/14/15 0359  CHOL 117   Cardiac Panel (last 3 results)  Recent Labs  09/13/15 2033 09/13/15 2320 09/14/15 0359  TROPONINI <0.03 <0.03 <0.03    Studies/Results: Procedures    Coronary Stent Intervention   Left Heart Cath and Coronary Angiography    Conclusion    1. Mid LAD lesion, 85% stenosed. Post complicated intervention with a integrity resolute DES 2.75 mm x 12 mm (3.2 mm), there is a 0% residual stenosis. 2. Prox LAD lesion, 40% stenosed. 3. Mid RCA stent, 30% in-stent restenosis. The lesion was previously treated with a stent (unknown type)  greater than two years ago. 4. Mid RCA to Dist RCA lesion, 40% stenosed following the stent. 5. 1st Mrg lesion, 45% stenosed. 6. The left ventricular systolic function is normal.   Likely culprit lesion was the significant focal lesion in the mid LAD treated with an Integrity Resolute DES stent. Unable to advance/deploy Promus DES stent.  Otherwise minimal CAD.    Left Heart    Left Ventricle The left ventricular size is normal. The left ventricular systolic function is normal. The left ventricular ejection fraction is 55-65% by visual estimate. There are no wall motion abnormalities in the left ventricle.   Aortic Valve There is no aortic valve stenosis, and no aortic valve regurgitation.       Assessment/Plan  Principal Problem:   Unstable angina [facility]) Active Problems:   Chest pain   Coronary artery disease involving native coronary artery of native heart with unstable angina pectoris (HCC)   1. CAD/ Unstable Angina: He ruled out for NSTEMI with negative cardiac enzymes x 3. LHC demonstrated a 85% mild LAD lesion, felt to be the culprit vessel. S/p PCI + DES utilizing a Resolute DES. Mild residual nonobstructive disease noted in the proximal LCA (40%), mid RCA stent (30% ISR), mid to distal RCA (40%) and 1st Marginal (45%). LVEF is normal at 55-60%. He is CP free. Continue DAPT with ASA + Brilinta, along with statin and BB. Vital signs stable. NSR on telemetry. 2+ Rt radial pulse. Renal function stable. LDL at goal of <70 mg/dL at 51 mg/dL. Will have patient ambulate with cardiac rehab this am. D/c home if stable.    LOS: 2 days    Brittainy M. Delmer Islam 09/15/2015 7:31 AM  Pt seen ande examined   When he got up and moved around he felt a little light headed and also had some arm discomfort/jaw Better now   EKG without change On exam, pt appears a little clammy  BP 120s  HR 60   LUngs CTA  Cardiac RRR  NO S3  Ext without edema  R wrist OK  WIll keep until at  least unitl later in day to make sure OK with ambulation    Dietrich Pates

## 2015-09-15 NOTE — Discharge Summary (Signed)
Discharge Summary    Patient ID: Kyle [name],  MRN: [id number], DOB/AGE: 05/19/1960 55 y.o.  Admit date: 09/13/2015 Discharge date: 09/16/2015  Primary Care Provider: Donzetta Sprung Primary Cardiologist: Dr. Purvis Sheffield  Discharge Diagnoses    Principal Problem:   Unstable angina [facility]) Active Problems:   Chest pain   Coronary artery disease involving native coronary artery of native heart with unstable angina pectoris (HCC)   Allergies No Known Allergies  Diagnostic Studies/Procedures   Procedures    Coronary Stent Intervention   Left Heart Cath and Coronary Angiography    Conclusion    1. Mid LAD lesion, 85% stenosed. Post complicated intervention with a integrity resolute DES 2.75 mm x 12 mm (3.2 mm), there is a 0% residual stenosis. 2. Prox LAD lesion, 40% stenosed. 3. Mid RCA stent, 30% in-stent restenosis. The lesion was previously treated with a stent (unknown type) greater than two years ago. 4. Mid RCA to Dist RCA lesion, 40% stenosed following the stent. 5. 1st Mrg lesion, 45% stenosed. 6. The left ventricular systolic function is normal.   Likely culprit lesion was the significant focal lesion in the mid LAD treated with an Integrity Resolute DES stent. Unable to advance/deploy Promus DES stent.  Otherwise minimal CAD.    Left Heart    Left Ventricle The left ventricular size is normal. The left ventricular systolic function is normal. The left ventricular ejection fraction is 55-65% by visual estimate. There are no wall motion abnormalities in the left ventricle.   Aortic Valve There is no aortic valve stenosis, and no aortic valve regurgitation.           History of Present Illness     55 yo male with PMH of PSVT s/p ablation 07/02/2012, HLD, and CAD s/p PCI to RCA in 2007 transferred from [facility] for unstable angina.    Hospital Course      He was admitted to telemetry. Cardiac enzymes were cycled x 3 and were  negative, ruling him out for MI. LHC demonstrated a 85% mild LAD lesion, felt to be the culprit vessel. S/p PCI + DES utilizing a Resolute DES. Mild residual nonobstructive disease noted in the proximal LAD (40%), mid RCA stent (30% ISR), mid to distal RCA (40%) and 1st Marginal (45%). LVEF is normal at 55-60%. He left the cath lab in stable condition. He denied recurrent CP. He was placed on DAPT with ASA + Brilinta, high intensity statin and a BB. He had no major post cath complications. Renal function and vital signs remained stable. His right radial cath site remained stable. NSR on telemetry. A fastling lipid panel was obtained which showed LDL to be at goal of < 70 mg/dL. LDL was 51 mg/dL.   On post cath day 1, he developed mild atypical CP  when ambulating with cardiac rehab and became nauseated with vomiting and mildly hypotensive, which improved with Zofran and IVFs. He was kept an additional night for observation. Symptoms improved. He had no recurrent pain ambulating. Nausea and vomiting resolved. BP was stable. He was last seen and examined by Dr. Tenny Craw who determined he was stable for d/c home. He will f/u with Dr. Purvis Sheffield in 1-2 weeks.    Consultants: none  Discharge Vitals Blood pressure 110/63, pulse (!) 58, temperature 97.7 F (36.5 C), resp. rate 13, height 6' (1.829 m), weight 206 lb (93.4 kg), SpO2 98 %.  Filed Weights   09/13/15 1649 09/15/15 0417 09/16/15 0500  Weight: 208 lb  8.9 oz (94.6 kg) 214 lb 1.1 oz (97.1 kg) 206 lb (93.4 kg)    Labs & Radiologic Studies    CBC  Recent Labs  09/13/15 2033 09/15/15 0528  WBC 7.6 9.8  NEUTROABS 4.8  --   HGB 15.0 14.4  HCT 45.0 43.7  MCV 91.6 91.0  PLT 185 183   Basic Metabolic Panel  Recent Labs  09/14/15 0359 09/15/15 0528  NA 139 137  K 3.8 3.7  CL 108 109  CO2 25 22  GLUCOSE 91 86  BUN 9 8  CREATININE 0.89 0.81  CALCIUM 9.2 8.9   Liver Function Tests No results for input(s): AST, ALT, ALKPHOS, BILITOT,  PROT, ALBUMIN in the last 72 hours. No results for input(s): LIPASE, AMYLASE in the last 72 hours. Cardiac Enzymes  Recent Labs  09/13/15 2033 09/13/15 2320 09/14/15 0359  TROPONINI <0.03 <0.03 <0.03   BNP Invalid input(s): POCBNP D-Dimer No results for input(s): DDIMER in the last 72 hours. Hemoglobin A1C No results for input(s): HGBA1C in the last 72 hours. Fasting Lipid Panel  Recent Labs  09/14/15 0359  CHOL 117  HDL 47  LDLCALC 51  TRIG 97  CHOLHDL 2.5   Thyroid Function Tests No results for input(s): TSH, T4TOTAL, T3FREE, THYROIDAB in the last 72 hours.  Invalid input(s): FREET3 _____________  No results found. Disposition   Pt is being discharged home today in good condition.  Follow-up Plans & Appointments    Follow-up Information    Prentice Docker, MD.   Specialty:  Cardiology Why:  our office will call you with a follow-up appointment in 1-2 weeks  Contact information: [address] [place] [address] [phone]          Discharge Instructions    Amb Referral to Cardiac Rehabilitation    Complete by:  As directed   Diagnosis:  Coronary Stents   Diet - low sodium heart healthy    Complete by:  As directed   Increase activity slowly    Complete by:  As directed      Discharge Medications   Current Discharge Medication List    START taking these medications   Details  !! ticagrelor (BRILINTA) 90 MG TABS tablet Take 1 tablet (90 mg total) by mouth 2 (two) times daily. Qty: 60 tablet, Refills: 10    !! ticagrelor (BRILINTA) 90 MG TABS tablet Take 1 tablet (90 mg total) by mouth 2 (two) times daily. Qty: 60 tablet, Refills: 0     !! - Potential duplicate medications found. Please discuss with provider.    CONTINUE these medications which have CHANGED   Details  nitroGLYCERIN (NITROSTAT) 0.4 MG SL tablet Place 1 tablet (0.4 mg total) under the tongue every 5 (five) minutes as needed for chest pain. Qty: 25 tablet, Refills:  2      CONTINUE these medications which have NOT CHANGED   Details  aspirin EC 81 MG tablet Take 81 mg by mouth daily.      atorvastatin (LIPITOR) 40 MG tablet Take 1 tablet (40 mg total) by mouth every evening. Qty: 90 tablet, Refills: 3    Coenzyme Q10 (COQ-10 PO) Take 1 capsule by mouth daily.    Fish Oil-Krill Oil (KRILL OIL PLUS) CAPS Take 1 capsule by mouth daily with breakfast.    Ginger, Zingiber officinalis, (GINGER EXTRACT PO) Take 1 capsule by mouth 2 (two) times daily.    metoprolol succinate (TOPROL-XL) 25 MG 24 hr tablet Take  1 tablet (25 mg total) by mouth 2 (two) times daily. Qty: 180 tablet, Refills: 3    Misc Natural Products (GNP RESVERATROL RED WINE EXT) CAPS Take 1 capsule by mouth daily.    Multiple Vitamins-Minerals (ONE-A-DAY MENS 50+ ADVANTAGE) TABS Take 1 tablet by mouth daily with breakfast.    naphazoline-pheniramine (NAPHCON-A) 0.025-0.3 % ophthalmic solution Place 1-2 drops into both eyes 2 (two) times daily as needed for irritation or allergies.    omeprazole (PRILOSEC) 20 MG capsule TAKE 1 CAPSULE BY MOUTH DAILY Qty: 90 capsule, Refills: 3      STOP taking these medications     Omega 3 1000 MG CAPS          Aspirin prescribed at discharge?  Yes High Intensity Statin Prescribed? (Lipitor 40-80mg  or Crestor 20-40mg ): Yes Beta Blocker Prescribed? Yes For EF <40%, was ACEI/ARB Prescribed? No: EF >40% ADP Receptor Inhibitor Prescribed? (i.e. Plavix etc.-Includes Medically Managed Patients): Yes For EF <40%, Aldosterone Inhibitor Prescribed? No: EF >40% Was EF assessed during THIS hospitalization? Yes Was Cardiac Rehab II ordered? (Included Medically managed Patients): Yes   Outstanding Labs/Studies   None   Duration of Discharge Encounter   Greater than 30 minutes including physician time.  Signed, Robbie Lis PA-C 09/16/2015, 9:13 AM

## 2015-09-15 NOTE — Progress Notes (Signed)
During ambulation with cardiac rehab patient developed right scapular and jaw pain.  After returning to his room he further complained of feeling "fuzzy headed", nauseous, diaphoretic, and left chest pressure, dull, 2/10.  All of these symptoms are described as mild and transient.  Morning meds given, including BB, nitro paste applied (not given at 0600), zofran given, EKG done.  Dr. Tenny Craw on unit and aware.  Hardin Negus, PA aware of symptoms while ambulating and will revisit patient.

## 2015-09-16 DIAGNOSIS — I200 Unstable angina: Secondary | ICD-10-CM | POA: Diagnosis not present

## 2015-09-16 MED ORDER — TICAGRELOR 90 MG PO TABS: 90.0000 mg | ORAL_TABLET | Freq: Two times a day (BID) | ORAL | 0 refills | Status: DC

## 2015-09-16 MED ORDER — TICAGRELOR 90 MG PO TABS: 90.0000 mg | ORAL_TABLET | Freq: Two times a day (BID) | ORAL | 10 refills | Status: DC

## 2015-09-16 MED ORDER — NITROGLYCERIN 0.4 MG SL SUBL: 0.4000 mg | SUBLINGUAL_TABLET | SUBLINGUAL | 2 refills | Status: AC | PRN

## 2015-09-16 NOTE — Progress Notes (Signed)
CM met with pt and gave pt Brilinta free 30 trial card.  Pt verbalized understanding the card will pay for today's discharge prescription and give insurance enough time to authorize for refills. NO other CM needs were communicated.

## 2015-09-16 NOTE — Progress Notes (Signed)
Verbal order received to discharge patient from [facility]

## 2015-09-16 NOTE — Progress Notes (Signed)
   Subjective: Feeling much better No CP  Did vomit yesterday   Objective: Vitals:   09/15/15 2200 09/15/15 2300 09/16/15 0000 09/16/15 0500  BP:    (!) 98/54  Pulse:    (!) 53  Resp: 18 17 (!) 24 13  Temp:    97.7 F (36.5 C)  TempSrc:      SpO2: 95% 97% 98% 98%  Weight:    206 lb (93.4 kg)  Height:       Weight change: -8 lb 1.1 oz (-3.659 kg)  Intake/Output Summary (Last 24 hours) at 09/16/15 0752 Last data filed at 09/16/15 0500  Gross per 24 hour  Intake             1340 ml  Output              900 ml  Net              440 ml    General: Alert, awake, oriented x3, in no acute distress Neck:  JVP is normal Heart: Regular rate and rhythm, without murmurs, rubs, gallops.  Lungs: Clear to auscultation.  No rales or wheezes. Exemities:  No edema.   Neuro: Grossly intact, nonfocal.  Tel  SR    Lab Results: Results for orders placed or performed during the hospital encounter of 09/13/15 (from the past 24 hour(s))  Glucose, capillary     Status: None   Collection Time: 09/15/15 12:59 PM  Result Value Ref Range   Glucose-Capillary 94 65 - 99 mg/dL    Studies/Results: No results found.  Medications: Reviewed     @PROBHOSP @  1  [place]  Pt with 85% mid LAD lesion  Underwent PCT/DES to LAD  Als has 40% LCx and 30% ISR of RCA 40% distal RCA aond 45% OM1.  LVEF ormal  Yesterday felt woozy, some chest tightness  Given IV Fluids    Vomited  Yesterday  Better now .  OK for d/c today    LOS: 3 days   Dietrich Pates 09/16/2015, 7:52 AM

## 2015-09-16 NOTE — Discharge Instructions (Signed)
Radial Site Care °Refer to this sheet in the next few weeks. These instructions provide you with information about caring for yourself after your procedure. Your health care provider may also give you more specific instructions. Your treatment has been planned according to current medical practices, but problems sometimes occur. Call your health care provider if you have any problems or questions after your procedure. °WHAT TO EXPECT AFTER THE PROCEDURE °After your procedure, it is typical to have the following: °· Bruising at the radial site that usually fades within 1-2 weeks. °· Blood collecting in the tissue (hematoma) that may be painful to the touch. It should usually decrease in size and tenderness within 1-2 weeks. °HOME CARE INSTRUCTIONS °· Take medicines only as directed by your health care provider. °· You may shower 24-48 hours after the procedure or as directed by your health care provider. Remove the bandage (dressing) and gently wash the site with plain soap and water. Pat the area dry with a clean towel. Do not rub the site, because this may cause bleeding. °· Do not take baths, swim, or use a hot tub until your health care provider approves. °· Check your insertion site every day for redness, swelling, or drainage. °· Do not apply powder or lotion to the site. °· Do not flex or bend the affected arm for 24 hours or as directed by your health care provider. °· Do not push or pull heavy objects with the affected arm for 24 hours or as directed by your health care provider. °· Do not lift over 10 lb (4.5 kg) for 5 days after your procedure or as directed by your health care provider. °· Ask your health care provider when it is okay to: °¨ Return to work or school. °¨ Resume usual physical activities or sports. °¨ Resume sexual activity. °· Do not drive home if you are discharged the same day as the procedure. Have someone else drive you. °· You may drive 24 hours after the procedure unless otherwise  instructed by your health care provider. °· Do not operate machinery or power tools for 24 hours after the procedure. °· If your procedure was done as an outpatient procedure, which means that you went home the same day as your procedure, a responsible adult should be with you for the first 24 hours after you arrive home. °· Keep all follow-up visits as directed by your health care provider. This is important. °SEEK MEDICAL CARE IF: °· You have a fever. °· You have chills. °· You have increased bleeding from the radial site. Hold pressure on the site. °SEEK IMMEDIATE MEDICAL CARE IF: °· You have unusual pain at the radial site. °· You have redness, warmth, or swelling at the radial site. °· You have drainage (other than a small amount of blood on the dressing) from the radial site. °· The radial site is bleeding, and the bleeding does not stop after 30 minutes of holding steady pressure on the site. °· Your arm or hand becomes pale, cool, tingly, or numb. °  °This information is not intended to replace advice given to you by your health care provider. Make sure you discuss any questions you have with your health care provider. °  °Document Released: 03/15/2010 Document Revised: 03/03/2014 Document Reviewed: 08/29/2013 °Elsevier Interactive Patient Education ©2016 Elsevier Inc. ° ° °Heart-Healthy Eating Plan °Many factors influence your heart health, including eating and exercise habits. Heart (coronary) risk increases with abnormal blood fat (lipid) levels. Heart-healthy meal planning   includes limiting unhealthy fats, increasing healthy fats, and making other small dietary changes. This includes maintaining a healthy body weight to help keep lipid levels within a normal range. °WHAT IS MY PLAN?  °Your health care provider recommends that you: °· Get no more than _________% of the total calories in your daily diet from fat. °· Limit your intake of saturated fat to less than _________% of your total calories each  day. °· Limit the amount of cholesterol in your diet to less than _________ mg per day. °WHAT TYPES OF FAT SHOULD I CHOOSE? °· Choose healthy fats more often. Choose monounsaturated and polyunsaturated fats, such as olive oil and canola oil, flaxseeds, walnuts, almonds, and seeds. °· Eat more omega-3 fats. Good choices include salmon, mackerel, sardines, tuna, flaxseed oil, and ground flaxseeds. Aim to eat fish at least two times each week. °· Limit saturated fats. Saturated fats are primarily found in animal products, such as meats, butter, and cream. Plant sources of saturated fats include palm oil, palm kernel oil, and coconut oil. °· Avoid foods with partially hydrogenated oils in them. These contain trans fats. Examples of foods that contain trans fats are stick margarine, some tub margarines, cookies, crackers, and other baked goods. °WHAT GENERAL GUIDELINES DO I NEED TO FOLLOW? °· Check food labels carefully to identify foods with trans fats or high amounts of saturated fat. °· Fill one half of your plate with vegetables and green salads. Eat 4-5 servings of vegetables per day. A serving of vegetables equals 1 cup of raw leafy vegetables, ½ cup of raw or cooked cut-up vegetables, or ½ cup of vegetable juice. °· Fill one fourth of your plate with whole grains. Look for the word "whole" as the first word in the ingredient list. °· Fill one fourth of your plate with lean protein foods. °· Eat 4-5 servings of fruit per day. A serving of fruit equals one medium whole fruit, ¼ cup of dried fruit, ½ cup of fresh, frozen, or canned fruit, or ½ cup of 100% fruit juice. °· Eat more foods that contain soluble fiber. Examples of foods that contain this type of fiber are apples, broccoli, carrots, beans, peas, and barley. Aim to get 20-30 g of fiber per day. °· Eat more home-cooked food and less restaurant, buffet, and fast food. °· Limit or avoid alcohol. °· Limit foods that are high in starch and sugar. °· Avoid fried  foods. °· Cook foods by using methods other than frying. Baking, boiling, grilling, and broiling are all great options. Other fat-reducing suggestions include: °¨ Removing the skin from poultry. °¨ Removing all visible fats from meats. °¨ Skimming the fat off of stews, soups, and gravies before serving them. °¨ Steaming vegetables in water or broth. °· Lose weight if you are overweight. Losing just 5-10% of your initial body weight can help your overall health and prevent diseases such as diabetes and heart disease. °· Increase your consumption of nuts, legumes, and seeds to 4-5 servings per week. One serving of dried beans or legumes equals ½ cup after being cooked, one serving of nuts equals 1½ ounces, and one serving of seeds equals ½ ounce or 1 tablespoon. °· You may need to monitor your salt (sodium) intake, especially if you have high blood pressure. Talk with your health care provider or dietitian to get more information about reducing sodium. °WHAT FOODS CAN I EAT? °Grains °Breads, including French, white, pita, wheat, raisin, rye, oatmeal, and Italian. Tortillas that are neither   fried nor made with lard or trans fat. Low-fat rolls, including hotdog and hamburger buns and English muffins. Biscuits. Muffins. Waffles. Pancakes. Light popcorn. Whole-grain cereals. Flatbread. Melba toast. Pretzels. Breadsticks. Rusks. Low-fat snacks and crackers, including oyster, saltine, matzo, graham, animal, and rye. Rice and pasta, including brown rice and those that are made with whole wheat. °Vegetables °All vegetables. °Fruits °All fruits, but limit coconut. °Meats and Other Protein Sources °Lean, well-trimmed beef, veal, pork, and lamb. Chicken and turkey without skin. All fish and shellfish. Wild duck, rabbit, pheasant, and venison. Egg whites or low-cholesterol egg substitutes. Dried beans, peas, lentils, and tofu. Seeds and most nuts. °Dairy °Low-fat or nonfat cheeses, including ricotta, string, and mozzarella. Skim  or 1% milk that is liquid, powdered, or evaporated. Buttermilk that is made with low-fat milk. Nonfat or low-fat yogurt. °Beverages °Mineral water. Diet carbonated beverages. °Sweets and Desserts °Sherbets and fruit ices. Honey, jam, marmalade, jelly, and syrups. Meringues and gelatins. Pure sugar candy, such as hard candy, jelly beans, gumdrops, mints, marshmallows, and small amounts of dark chocolate. Angel food cake. °Eat all sweets and desserts in moderation. °Fats and Oils °Nonhydrogenated (trans-free) margarines. Vegetable oils, including soybean, sesame, sunflower, olive, peanut, safflower, corn, canola, and cottonseed. Salad dressings or mayonnaise that are made with a vegetable oil. Limit added fats and oils that you use for cooking, baking, salads, and as spreads. °Other °Cocoa powder. Coffee and tea. All seasonings and condiments. °The items listed above may not be a complete list of recommended foods or beverages. Contact your dietitian for more options. °WHAT FOODS ARE NOT RECOMMENDED? °Grains °Breads that are made with saturated or trans fats, oils, or whole milk. Croissants. Butter rolls. Cheese breads. Sweet rolls. Donuts. Buttered popcorn. Chow mein noodles. High-fat crackers, such as cheese or butter crackers. °Meats and Other Protein Sources °Fatty meats, such as hotdogs, short ribs, sausage, spareribs, bacon, ribeye roast or steak, and mutton. High-fat deli meats, such as salami and bologna. Caviar. Domestic duck and goose. Organ meats, such as kidney, liver, sweetbreads, brains, gizzard, chitterlings, and heart. °Dairy °Cream, sour cream, cream cheese, and creamed cottage cheese. Whole milk cheeses, including blue (bleu), Monterey Jack, Brie, Colby, American, Havarti, Swiss, cheddar, Camembert, and [place].  Whole or 2% milk that is liquid, evaporated, or condensed. Whole buttermilk. Cream sauce or high-fat cheese sauce. Yogurt that is made from whole milk. °Beverages °Regular sodas and drinks  with added sugar. °Sweets and Desserts °Frosting. Pudding. Cookies. Cakes other than angel food cake. Candy that has milk chocolate or white chocolate, hydrogenated fat, butter, coconut, or unknown ingredients. Buttered syrups. Full-fat ice cream or ice cream drinks. °Fats and Oils °Gravy that has suet, meat fat, or shortening. Cocoa butter, hydrogenated oils, palm oil, coconut oil, palm kernel oil. These can often be found in baked products, candy, fried foods, nondairy creamers, and whipped toppings. Solid fats and shortenings, including bacon fat, salt pork, lard, and butter. Nondairy cream substitutes, such as coffee creamers and sour cream substitutes. Salad dressings that are made of unknown oils, cheese, or sour cream. °The items listed above may not be a complete list of foods and beverages to avoid. Contact your dietitian for more information. °  °This information is not intended to replace advice given to you by your health care provider. Make sure you discuss any questions you have with your health care provider. °  °Document Released: 11/20/2007 Document Revised: 03/03/2014 Document Reviewed: 08/04/2013 °Elsevier Interactive Patient Education ©2016 Elsevier Inc. ° °

## 2015-09-17 ENCOUNTER — Encounter (HOSPITAL_COMMUNITY): Payer: Self-pay | Admitting: Cardiology

## 2016-01-22 ENCOUNTER — Encounter (HOSPITAL_COMMUNITY): Payer: No Typology Code available for payment source

## 2016-02-26 ENCOUNTER — Encounter (HOSPITAL_COMMUNITY): Payer: No Typology Code available for payment source

## 2016-02-26 IMAGING — NM NM MYOCAR MULTI W/SPECT W/WALL MOTION & EF
2 series · 12 of 12 positions shown · non-contrast
Comparison: None.

CLINICAL DATA: 53-year-old male with coronary artery disease and
RCA stent who has been experiencing palpitations and chest tightness
and is referred for an ischemic evaluation.

EXAM:
MYOCARDIAL IMAGING WITH SPECT (REST AND EXERCISE)
GATED LEFT VENTRICULAR WALL MOTION STUDY
LEFT VENTRICULAR EJECTION FRACTION
TECHNIQUE: Standard myocardial SPECT imaging was performed after resting
intravenous injection of 10 mCi Ac-ZZm sestamibi. Subsequently,
exercise tolerance test was performed by the patient under the
supervision of the Cardiology staff. At peak-stress, 30 mCi Ac-ZZm
sestamibi was injected intravenously and standard myocardial SPECT
imaging was performed. Quantitative gated imaging was also performed
to evaluate left ventricular wall motion, and estimate left
ventricular ejection fraction.

[Series 1: rest · 8.28mm/px · 6 of 64 frames shown]
[frame 6/64]
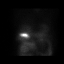
[frame 16/64]
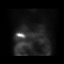
[frame 27/64]
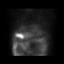
[frame 38/64]
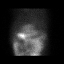
[frame 48/64]
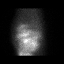
[frame 59/64]
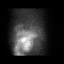

[Series 2: stress gated · 8.28mm/px · 6 of 64 frames shown]
[frame 6/64]
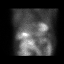
[frame 16/64]
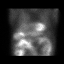
[frame 27/64]
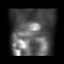
[frame 38/64]
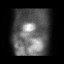
[frame 48/64]
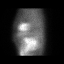
[frame 59/64]
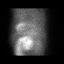

[12 of 12 positions shown; findings below may reference images not displayed]

FINDINGS: Stress/ECG data: The patient was stressed according to the Bruce
protocol for 10 min 52 seconds achieving a work level of 13.7 Mets.
The resting heart rate of 62 beats per min rose to a maximal heart
rate of 157 beats per min. This value represents 94% of the maximal,
age predicted heart rate. The resting blood pressure of 118/82 rose
to a maximum blood pressure of 152/80. No chest pain was reported.

Resting ECG demonstrated normal sinus rhythm. With exercise there
were no ischemic ST segment or T-wave abnormalities, nor any
arrhythmias.

Perfusion: No decreased activity in the left ventricle on stress
imaging to suggest reversible ischemia or infarction.

Wall Motion: Normal left ventricular wall motion. No left
ventricular dilation.

Left Ventricular Ejection Fraction: 70 %

End diastolic volume 80 ml

End systolic volume 24 ml
IMPRESSION: 1. No reversible ischemia or infarction.

2. Normal left ventricular wall motion.

3. Left ventricular ejection fraction 70%

4. Low-risk stress test findings*.

5.  Excellent exercise tolerance.

*4144 Appropriate Use Criteria for Coronary Revascularization
Focused Update: J Am Coll Cardiol. 4144;59(9):857-881.
[URL]

## 2016-04-03 ENCOUNTER — Encounter (HOSPITAL_COMMUNITY): Admission: RE | Admit: 2016-04-03 | Payer: No Typology Code available for payment source | Source: Ambulatory Visit

## 2018-11-17 ENCOUNTER — Ambulatory Visit (INDEPENDENT_AMBULATORY_CARE_PROVIDER_SITE_OTHER): Payer: No Typology Code available for payment source | Admitting: Urology

## 2018-11-17 DIAGNOSIS — N5201 Erectile dysfunction due to arterial insufficiency: Secondary | ICD-10-CM

## 2019-01-05 ENCOUNTER — Ambulatory Visit (INDEPENDENT_AMBULATORY_CARE_PROVIDER_SITE_OTHER): Payer: No Typology Code available for payment source | Admitting: Urology

## 2019-01-05 DIAGNOSIS — N5201 Erectile dysfunction due to arterial insufficiency: Secondary | ICD-10-CM

## 2019-02-08 ENCOUNTER — Emergency Department (HOSPITAL_COMMUNITY)
Admission: EM | Admit: 2019-02-08 | Discharge: 2019-02-08 | Disposition: A | Discharge status: Home / Self Care | Payer: No Typology Code available for payment source | Attending: Emergency Medicine | Admitting: Emergency Medicine

## 2019-02-08 ENCOUNTER — Other Ambulatory Visit: Payer: Self-pay

## 2019-02-08 ENCOUNTER — Emergency Department (HOSPITAL_COMMUNITY): Payer: No Typology Code available for payment source

## 2019-02-08 ENCOUNTER — Encounter (HOSPITAL_COMMUNITY): Payer: Self-pay

## 2019-02-08 DIAGNOSIS — Z79899 Other long term (current) drug therapy: Secondary | ICD-10-CM | POA: Insufficient documentation

## 2019-02-08 DIAGNOSIS — R0789 Other chest pain: Secondary | ICD-10-CM | POA: Diagnosis present

## 2019-02-08 DIAGNOSIS — I2510 Atherosclerotic heart disease of native coronary artery without angina pectoris: Secondary | ICD-10-CM | POA: Diagnosis not present

## 2019-02-08 DIAGNOSIS — Z7982 Long term (current) use of aspirin: Secondary | ICD-10-CM | POA: Diagnosis not present

## 2019-02-08 DIAGNOSIS — Z87891 Personal history of nicotine dependence: Secondary | ICD-10-CM | POA: Diagnosis not present

## 2019-02-08 LAB — TROPONIN I (HIGH SENSITIVITY)
Troponin I (High Sensitivity): 3 ng/L (ref <18)
Troponin I (High Sensitivity): <2 ng/L (ref <18)

## 2019-02-08 LAB — CBC
HCT: 50.1 % (ref 39.0–52.0)
Hemoglobin: 16.5 g/dL (ref 13.0–17.0)
MCH: 29.4 pg (ref 26.0–34.0)
MCHC: 32.9 g/dL (ref 30.0–36.0)
MCV: 89.3 fL (ref 80.0–100.0)
Platelets: 246 10*3/uL (ref 150–400)
RBC: 5.61 MIL/uL (ref 4.22–5.81)
RDW: 12.4 % (ref 11.5–15.5)
WBC: 7.8 10*3/uL (ref 4.0–10.5)
nRBC: 0 % (ref 0.0–0.2)

## 2019-02-08 LAB — HEPATIC FUNCTION PANEL
ALT: 29 U/L (ref 0–44)
AST: 27 U/L (ref 15–41)
Albumin: 4.3 g/dL (ref 3.5–5.0)
Alkaline Phosphatase: 43 U/L (ref 38–126)
Bilirubin, Direct: <0.1 mg/dL (ref 0.0–0.2)
Total Bilirubin: 0.7 mg/dL (ref 0.3–1.2)
Total Protein: 7.8 g/dL (ref 6.5–8.1)

## 2019-02-08 LAB — DIFFERENTIAL
Abs Immature Granulocytes: 0.02 10*3/uL (ref 0.00–0.07)
Basophils Absolute: 0.1 10*3/uL (ref 0.0–0.1)
Basophils Relative: 1 %
Eosinophils Absolute: 0.3 10*3/uL (ref 0.0–0.5)
Eosinophils Relative: 4 %
Immature Granulocytes: 0 %
Lymphocytes Relative: 28 %
Lymphs Abs: 2.2 10*3/uL (ref 0.7–4.0)
Monocytes Absolute: 0.6 10*3/uL (ref 0.1–1.0)
Monocytes Relative: 7 %
Neutro Abs: 4.7 10*3/uL (ref 1.7–7.7)
Neutrophils Relative %: 60 %

## 2019-02-08 LAB — BASIC METABOLIC PANEL
Anion gap: 9 (ref 5–15)
BUN: 15 mg/dL (ref 6–20)
CO2: 23 mmol/L (ref 22–32)
Calcium: 9.2 mg/dL (ref 8.9–10.3)
Chloride: 106 mmol/L (ref 98–111)
Creatinine, Ser: 0.79 mg/dL (ref 0.61–1.24)
GFR calc Af Amer: >60 mL/min (ref >60)
GFR calc non Af Amer: >60 mL/min (ref >60)
Glucose, Bld: 116 mg/dL — ABNORMAL HIGH (ref 70–99)
Potassium: 3.7 mmol/L (ref 3.5–5.1)
Sodium: 138 mmol/L (ref 135–145)

## 2019-02-08 MED ORDER — ISOSORBIDE MONONITRATE ER 30 MG PO TB24: ORAL_TABLET | ORAL | 0 refills | Status: DC

## 2019-02-08 NOTE — ED Triage Notes (Signed)
Pt reports history of svt.  Reports has had stents placed and an ablation.  Reports has been having frequent, "skipped beats" since Thursday.  This morning he was laying in the bed and had sudden onset of pain in left chest and arm, became diaphoretic, weak, and nauseated.  Reports pain has subsided but still feels weak and nauseated.

## 2019-02-08 NOTE — Discharge Instructions (Signed)
Follow-up with your cardiologist or Dr. Bronson Ing in the next couple weeks.  Return if problems

## 2019-02-08 NOTE — ED Provider Notes (Signed)
[facility] EMERGENCY DEPARTMENT Provider Note   CSN: [id number] Arrival date & time: 02/08/19  0747     History Chief Complaint  Patient presents with  . Chest Pain    Kyle [name] is a 58 y.o. male.  Patient states he had an episode of chest discomfort that lasts about 5 minutes but then had some perspiring that lasted over 20 minutes.  Patient feels okay now. patient has a history of a stent  The history is provided by the patient. No language interpreter was used.  Chest Pain Pain location:  L chest Pain quality: aching   Pain radiates to:  Does not radiate Pain severity:  Moderate Onset quality:  Sudden Timing:  Sporadic Progression:  Resolved Chronicity:  New Context: not breathing   Associated symptoms: no abdominal pain, no back pain, no cough, no fatigue and no headache        Past Medical History:  Diagnosis Date  . Chronic back pain   . Coronary artery disease    s/p RCA PCI with a drug eluting stent  . Hyperlipidemia   . PSVT (paroxysmal supraventricular tachycardia) [facility])     Patient Active Problem List   Diagnosis Date Noted  . Coronary artery disease involving native coronary artery of native heart with unstable angina pectoris ([place]) 09/14/2015  . Unstable angina ([place]) 09/14/2015  . Chest pain 09/13/2015  . GERD (gastroesophageal reflux disease) 07/11/2014  . Coronary artery disease   . PSVT (paroxysmal supraventricular tachycardia) ([place]) 02/24/2011    Past Surgical History:  Procedure Laterality Date  . CARDIAC CATHETERIZATION  2008   mild nonobstructive CAD (40% proximal LAD stenosis). patent mid RCA stent  . CARDIAC CATHETERIZATION N/A 09/14/2015   Procedure: Left Heart Cath and Coronary Angiography;  Surgeon: Leonie Man, MD;  Location: [place] CV LAB;  Service: Cardiovascular;  Laterality: N/A;  . CARDIAC CATHETERIZATION N/A 09/14/2015   Procedure: Coronary Stent Intervention;  Surgeon: Leonie Man, MD;  Location: [place] [facility] LAB;  Service: Cardiovascular;  Laterality: N/A;  . CORONARY STENT PLACEMENT     mid RCA: 3.5 X 16 mm Taxus DES  . LEG SURGERY    . SUPRAVENTRICULAR TACHYCARDIA ABLATION N/A 07/02/2012   Procedure: SUPRAVENTRICULAR TACHYCARDIA ABLATION;  Surgeon: Evans Lance, MD;  Location: [facility] CATH LAB;  Service: Cardiovascular;  Laterality: N/A;       Family History  Problem Relation Age of Onset  . Epilepsy Other   . AAA (abdominal aortic aneurysm) Other   . CAD Father        PCI  . Other Father        Carotid Artery Disease  . Other Son        bicuspid aortic valve    Social History   Tobacco Use  . Smoking status: Former Smoker    Packs/day: 0.75    Years: 15.00    Pack years: 11.25    Types: Cigarettes    Start date: 10/11/1983    Quit date: 02/24/1998    Years since quitting: 20.9  . Smokeless tobacco: Former Systems developer    Types: Snuff    Quit date: 10/25/2013  . Tobacco comment: dip occasional on weekend  Substance Use Topics  . Alcohol use: No    Alcohol/week: 0.0 standard drinks  . Drug use: No    Home Medications Prior to Admission medications   Medication Sig Start Date End Date Taking? Authorizing Provider  aspirin EC 81 MG tablet Take  81 mg by mouth daily.     Yes [provider]  atorvastatin (LIPITOR) 40 MG tablet Take 1 tablet (40 mg total) by mouth every evening. 05/23/15  Yes Laqueta Linden, MD  B Complex-C (B-COMPLEX WITH VITAMIN C) tablet Take 1 tablet by mouth daily.   Yes [provider]  finasteride (PROPECIA) 1 MG tablet Take 1 mg by mouth daily. 01/14/19  Yes [provider]  fluticasone (FLONASE) 50 MCG/ACT nasal spray Place 2 sprays into both nostrils daily as needed for allergies or rhinitis.   Yes [provider]  Ginger, Zingiber officinalis, (GINGER EXTRACT PO) Take 1 capsule by mouth 2 (two) times daily.   Yes [provider]  metoprolol succinate (TOPROL-XL) 25 MG 24 hr tablet Take 1 tablet (25 mg  total) by mouth 2 (two) times daily. 05/23/15  Yes Laqueta Linden, MD  nitroGLYCERIN (NITROSTAT) 0.4 MG SL tablet Place 1 tablet (0.4 mg total) under the tongue every 5 (five) minutes as needed for chest pain. 09/16/15  Yes Simmons, Brittainy M, PA-C  Omega-3 Fatty Acids (SALMON OIL-1000) 200 MG CAPS Take 1 capsule by mouth daily.   Yes [provider]  VITAMIN A PO Take 1 capsule by mouth daily.   Yes [provider]  VITAMIN D, ERGOCALCIFEROL, PO Take 1 capsule by mouth daily.   Yes [provider]  VITAMIN E PO Take 1 capsule by mouth daily.   Yes [provider]  isosorbide mononitrate (IMDUR) 30 MG 24 hr tablet Take one half a tablet at bedtime 02/08/19   Bethann Berkshire, MD    Allergies    Patient has no known allergies.  Review of Systems   Review of Systems  Constitutional: Negative for appetite change and fatigue.  HENT: Negative for congestion, ear discharge and sinus pressure.   Eyes: Negative for discharge.  Respiratory: Negative for cough.   Cardiovascular: Positive for chest pain.  Gastrointestinal: Negative for abdominal pain and diarrhea.  Genitourinary: Negative for frequency and hematuria.  Musculoskeletal: Negative for back pain.  Skin: Negative for rash.  Neurological: Negative for seizures and headaches.  Psychiatric/Behavioral: Negative for hallucinations.    Physical Exam Updated Vital Signs BP 121/81   Pulse 67   Temp 98.8 F (37.1 C) (Oral)   Resp 14   Ht 6' (1.829 m)   Wt 95.3 kg   SpO2 98%   BMI 28.48 kg/m   Physical Exam Vitals and nursing note reviewed.  Constitutional:      Appearance: He is well-developed.  HENT:     Head: Normocephalic.     Nose: Nose normal.  Eyes:     General: No scleral icterus.    Conjunctiva/sclera: Conjunctivae normal.  Neck:     Thyroid: No thyromegaly.  Cardiovascular:     Rate and Rhythm: Normal rate and regular rhythm.     Heart sounds: No murmur. No friction rub. No  gallop.   Pulmonary:     Breath sounds: No stridor. No wheezing or rales.  Chest:     Chest wall: No tenderness.  Abdominal:     General: There is no distension.     Tenderness: There is no abdominal tenderness. There is no rebound.  Musculoskeletal:        General: Normal range of motion.     Cervical back: Neck supple.  Lymphadenopathy:     Cervical: No cervical adenopathy.  Skin:    Findings: No erythema or rash.  Neurological:  Mental Status: He is oriented to person, place, and time.     Motor: No abnormal muscle tone.     Coordination: Coordination normal.  Psychiatric:        Behavior: Behavior normal.     ED Results / Procedures / Treatments   Labs (all labs ordered are listed, but only abnormal results are displayed) Labs Reviewed  BASIC METABOLIC PANEL - Abnormal; Notable for the following components:      Result Value   Glucose, Bld 116 (*)    All other components within normal limits  CBC  HEPATIC FUNCTION PANEL  DIFFERENTIAL  TROPONIN I (HIGH SENSITIVITY)  TROPONIN I (HIGH SENSITIVITY)    EKG EKG Interpretation  Date/Time:  Tuesday February 08 2019 08:02:31 EST Ventricular Rate:  80 PR Interval:    QRS Duration: 100 QT Interval:  388 QTC Calculation: 448 R Axis:   86 Text Interpretation: Sinus rhythm Prolonged PR interval Confirmed by Bethann BerkshireZammit, Rexford Prevo [phone]([id number]) on 02/08/2019 8:09:35 AM   Radiology DG Chest 2 View  Result Date: 02/08/2019 CLINICAL DATA:  Tachycardia.  Chest pain. EXAM: CHEST - 2 VIEW COMPARISON:  03/25/2006 FINDINGS: Artifact overlies the chest. Heart size is normal. Mediastinal shadows are normal. Lungs are clear. The vascularity is normal. No effusions. No significant bone finding. IMPRESSION: No active cardiopulmonary disease. Electronically Signed   By: Paulina FusiMark  Shogry M.D.   On: 02/08/2019 08:38    Procedures Procedures (including critical care time)  Medications Ordered in ED Medications - No data to display  ED Course    I have reviewed the triage vital signs and the nursing notes.  Pertinent labs & imaging results that were available during my care of the patient were reviewed by me and considered in my medical decision making (see chart for details).    MDM Rules/Calculators/A&P                      Patient with a history of coronary artery disease.  Patient's EKG and 2 troponins are unremarkable.  I spoke with Dr. Diona BrownerMcDowell with cardiology and he recommended starting 15 mg of Imdur at bedtime and following up with cardiology in the next couple weeks.  The patient was told he can see Dr. Purvis SheffieldKoneswaran here or [facility] Final Clinical Impression(s) / ED Diagnoses Final diagnoses:  Atypical chest pain    Rx / DC Orders ED Discharge Orders         Ordered    isosorbide mononitrate (IMDUR) 30 MG 24 hr tablet     02/08/19 1157           Bethann BerkshireZammit, Taige Housman, MD 02/08/19 1200

## 2019-02-16 ENCOUNTER — Encounter (HOSPITAL_COMMUNITY): Payer: Self-pay | Admitting: Emergency Medicine

## 2019-02-16 ENCOUNTER — Emergency Department (HOSPITAL_COMMUNITY): Payer: No Typology Code available for payment source

## 2019-02-16 ENCOUNTER — Other Ambulatory Visit: Payer: Self-pay

## 2019-02-16 DIAGNOSIS — I25118 Atherosclerotic heart disease of native coronary artery with other forms of angina pectoris: Principal | ICD-10-CM | POA: Insufficient documentation

## 2019-02-16 DIAGNOSIS — Z7982 Long term (current) use of aspirin: Secondary | ICD-10-CM | POA: Diagnosis not present

## 2019-02-16 DIAGNOSIS — Z87891 Personal history of nicotine dependence: Secondary | ICD-10-CM | POA: Diagnosis not present

## 2019-02-16 DIAGNOSIS — E785 Hyperlipidemia, unspecified: Secondary | ICD-10-CM | POA: Insufficient documentation

## 2019-02-16 DIAGNOSIS — Z7902 Long term (current) use of antithrombotics/antiplatelets: Secondary | ICD-10-CM | POA: Diagnosis not present

## 2019-02-16 DIAGNOSIS — Z20828 Contact with and (suspected) exposure to other viral communicable diseases: Secondary | ICD-10-CM | POA: Insufficient documentation

## 2019-02-16 DIAGNOSIS — Z79899 Other long term (current) drug therapy: Secondary | ICD-10-CM | POA: Diagnosis not present

## 2019-02-16 DIAGNOSIS — I200 Unstable angina: Secondary | ICD-10-CM | POA: Diagnosis present

## 2019-02-16 DIAGNOSIS — I471 Supraventricular tachycardia: Secondary | ICD-10-CM | POA: Insufficient documentation

## 2019-02-16 LAB — BASIC METABOLIC PANEL
Anion gap: 12 (ref 5–15)
BUN: 17 mg/dL (ref 6–20)
CO2: 22 mmol/L (ref 22–32)
Calcium: 8.8 mg/dL — ABNORMAL LOW (ref 8.9–10.3)
Chloride: 102 mmol/L (ref 98–111)
Creatinine, Ser: 0.92 mg/dL (ref 0.61–1.24)
GFR calc Af Amer: >60 mL/min (ref >60)
GFR calc non Af Amer: >60 mL/min (ref >60)
Glucose, Bld: 142 mg/dL — ABNORMAL HIGH (ref 70–99)
Potassium: 3.6 mmol/L (ref 3.5–5.1)
Sodium: 136 mmol/L (ref 135–145)

## 2019-02-16 LAB — CBC
HCT: 48.1 % (ref 39.0–52.0)
Hemoglobin: 15.5 g/dL (ref 13.0–17.0)
MCH: 29.1 pg (ref 26.0–34.0)
MCHC: 32.2 g/dL (ref 30.0–36.0)
MCV: 90.4 fL (ref 80.0–100.0)
Platelets: 240 10*3/uL (ref 150–400)
RBC: 5.32 MIL/uL (ref 4.22–5.81)
RDW: 12.7 % (ref 11.5–15.5)
WBC: 8.6 10*3/uL (ref 4.0–10.5)
nRBC: 0 % (ref 0.0–0.2)

## 2019-02-16 LAB — TROPONIN I (HIGH SENSITIVITY): Troponin I (High Sensitivity): 2 ng/L (ref <18)

## 2019-02-16 MED ORDER — SODIUM CHLORIDE 0.9% FLUSH: 3.0000 mL | Freq: Once | INTRAVENOUS | Status: DC

## 2019-02-16 NOTE — ED Triage Notes (Signed)
Pt reports he laid down for bed and began having left sided chest pain, sweating, left jaw and arm pain with nausea, pt reports he has 2 stents and is awaiting a cardiac stress test

## 2019-02-17 ENCOUNTER — Encounter: Payer: Self-pay | Admitting: Cardiology

## 2019-02-17 ENCOUNTER — Observation Stay (HOSPITAL_BASED_OUTPATIENT_CLINIC_OR_DEPARTMENT_OTHER): Payer: No Typology Code available for payment source

## 2019-02-17 ENCOUNTER — Encounter (HOSPITAL_COMMUNITY)
Admission: EM | Disposition: A | Discharge status: Home / Self Care | Payer: Self-pay | Source: Home / Self Care | Attending: Cardiovascular Disease

## 2019-02-17 ENCOUNTER — Other Ambulatory Visit: Payer: Self-pay

## 2019-02-17 ENCOUNTER — Telehealth: Payer: Self-pay | Admitting: Medical

## 2019-02-17 ENCOUNTER — Observation Stay (HOSPITAL_COMMUNITY)
Admission: EM | Admit: 2019-02-17 | Discharge: 2019-02-17 | Disposition: A | Discharge status: Home / Self Care | Payer: No Typology Code available for payment source | Attending: Cardiovascular Disease | Admitting: Cardiovascular Disease

## 2019-02-17 DIAGNOSIS — Z20828 Contact with and (suspected) exposure to other viral communicable diseases: Secondary | ICD-10-CM | POA: Diagnosis not present

## 2019-02-17 DIAGNOSIS — I200 Unstable angina: Secondary | ICD-10-CM

## 2019-02-17 DIAGNOSIS — I471 Supraventricular tachycardia: Secondary | ICD-10-CM | POA: Diagnosis not present

## 2019-02-17 DIAGNOSIS — I25118 Atherosclerotic heart disease of native coronary artery with other forms of angina pectoris: Secondary | ICD-10-CM | POA: Diagnosis not present

## 2019-02-17 DIAGNOSIS — E785 Hyperlipidemia, unspecified: Secondary | ICD-10-CM

## 2019-02-17 DIAGNOSIS — I25110 Atherosclerotic heart disease of native coronary artery with unstable angina pectoris: Secondary | ICD-10-CM

## 2019-02-17 DIAGNOSIS — R079 Chest pain, unspecified: Secondary | ICD-10-CM

## 2019-02-17 DIAGNOSIS — I2510 Atherosclerotic heart disease of native coronary artery without angina pectoris: Secondary | ICD-10-CM | POA: Diagnosis present

## 2019-02-17 DIAGNOSIS — E782 Mixed hyperlipidemia: Secondary | ICD-10-CM

## 2019-02-17 HISTORY — PX: LEFT HEART CATH AND CORONARY ANGIOGRAPHY: CATH118249

## 2019-02-17 HISTORY — PX: CORONARY PRESSURE/FFR STUDY: CATH118243

## 2019-02-17 LAB — CBC
HCT: 44.5 % (ref 39.0–52.0)
Hemoglobin: 15 g/dL (ref 13.0–17.0)
MCH: 30 pg (ref 26.0–34.0)
MCHC: 33.7 g/dL (ref 30.0–36.0)
MCV: 89 fL (ref 80.0–100.0)
Platelets: 231 10*3/uL (ref 150–400)
RBC: 5 MIL/uL (ref 4.22–5.81)
RDW: 12.5 % (ref 11.5–15.5)
WBC: 9.5 10*3/uL (ref 4.0–10.5)
nRBC: 0 % (ref 0.0–0.2)

## 2019-02-17 LAB — BASIC METABOLIC PANEL
Anion gap: 6 (ref 5–15)
BUN: 12 mg/dL (ref 6–20)
CO2: 24 mmol/L (ref 22–32)
Calcium: 8.8 mg/dL — ABNORMAL LOW (ref 8.9–10.3)
Chloride: 111 mmol/L (ref 98–111)
Creatinine, Ser: 0.86 mg/dL (ref 0.61–1.24)
GFR calc Af Amer: >60 mL/min (ref >60)
GFR calc non Af Amer: >60 mL/min (ref >60)
Glucose, Bld: 97 mg/dL (ref 70–99)
Potassium: 3.9 mmol/L (ref 3.5–5.1)
Sodium: 141 mmol/L (ref 135–145)

## 2019-02-17 LAB — SARS CORONAVIRUS 2 (TAT 6-24 HRS): SARS Coronavirus 2: NEGATIVE

## 2019-02-17 LAB — HIV ANTIBODY (ROUTINE TESTING W REFLEX): HIV Screen 4th Generation wRfx: NONREACTIVE

## 2019-02-17 LAB — HEMOGLOBIN A1C
Hgb A1c MFr Bld: 5.5 % (ref 4.8–5.6)
Mean Plasma Glucose: 111.15 mg/dL

## 2019-02-17 LAB — ECHOCARDIOGRAM COMPLETE
Height: 72 in
Weight: 3463.87 oz

## 2019-02-17 LAB — TROPONIN I (HIGH SENSITIVITY)
Troponin I (High Sensitivity): 3 ng/L (ref <18)
Troponin I (High Sensitivity): 3 ng/L (ref <18)
Troponin I (High Sensitivity): 4 ng/L (ref <18)

## 2019-02-17 LAB — POCT ACTIVATED CLOTTING TIME: Activated Clotting Time: 257 seconds

## 2019-02-17 LAB — RESPIRATORY PANEL BY RT PCR (FLU A&B, COVID)
Influenza A by PCR: NEGATIVE
Influenza B by PCR: NEGATIVE
SARS Coronavirus 2 by RT PCR: NEGATIVE

## 2019-02-17 LAB — MRSA PCR SCREENING: MRSA by PCR: POSITIVE — AB

## 2019-02-17 LAB — BRAIN NATRIURETIC PEPTIDE: B Natriuretic Peptide: 13.5 pg/mL (ref 0.0–100.0)

## 2019-02-17 LAB — HEPARIN LEVEL (UNFRACTIONATED): Heparin Unfractionated: 0.58 IU/mL (ref 0.30–0.70)

## 2019-02-17 SURGERY — LEFT HEART CATH AND CORONARY ANGIOGRAPHY
Anesthesia: LOCAL

## 2019-02-17 MED ORDER — NITROGLYCERIN 1 MG/10 ML FOR IR/CATH LAB
INTRA_ARTERIAL | Status: AC
  Filled 2019-02-17: qty 10

## 2019-02-17 MED ORDER — SODIUM CHLORIDE 0.9% FLUSH: 3.0000 mL | INTRAVENOUS | Status: DC | PRN

## 2019-02-17 MED ORDER — VERAPAMIL HCL 2.5 MG/ML IV SOLN
INTRAVENOUS | Status: AC
  Filled 2019-02-17: qty 2

## 2019-02-17 MED ORDER — OXYCODONE HCL 5 MG PO TABS: 5.0000 mg | ORAL_TABLET | ORAL | Status: DC | PRN

## 2019-02-17 MED ORDER — SODIUM CHLORIDE 0.9 % WEIGHT BASED INFUSION
3.0000 mL/kg/h | INTRAVENOUS | Status: DC
  Administered 2019-02-17: 3 mL/kg/h via INTRAVENOUS

## 2019-02-17 MED ORDER — HEPARIN (PORCINE) IN NACL 1000-0.9 UT/500ML-% IV SOLN
INTRAVENOUS | Status: AC
  Filled 2019-02-17: qty 1000

## 2019-02-17 MED ORDER — FENTANYL CITRATE (PF) 100 MCG/2ML IJ SOLN
INTRAMUSCULAR | Status: DC | PRN
  Administered 2019-02-17: 25 ug via INTRAVENOUS

## 2019-02-17 MED ORDER — HEPARIN BOLUS VIA INFUSION
4000.0000 [IU] | Freq: Once | INTRAVENOUS | Status: AC
  Administered 2019-02-17: 4000 [IU] via INTRAVENOUS

## 2019-02-17 MED ORDER — MUPIROCIN 2 % EX OINT
TOPICAL_OINTMENT | Freq: Two times a day (BID) | CUTANEOUS | Status: DC
  Filled 2019-02-17: qty 22

## 2019-02-17 MED ORDER — SODIUM CHLORIDE 0.9% FLUSH: 3.0000 mL | Freq: Two times a day (BID) | INTRAVENOUS | Status: DC

## 2019-02-17 MED ORDER — CLOPIDOGREL BISULFATE 75 MG PO TABS: 75.0000 mg | ORAL_TABLET | Freq: Every day | ORAL | 11 refills | Status: AC

## 2019-02-17 MED ORDER — IOHEXOL 350 MG/ML SOLN
INTRAVENOUS | Status: DC | PRN
  Administered 2019-02-17: 115 mL

## 2019-02-17 MED ORDER — ASPIRIN 81 MG PO CHEW
324.0000 mg | CHEWABLE_TABLET | Freq: Once | ORAL | Status: AC
  Administered 2019-02-17: 324 mg via ORAL
  Filled 2019-02-17: qty 4

## 2019-02-17 MED ORDER — ASPIRIN 81 MG PO CHEW: 81.0000 mg | CHEWABLE_TABLET | Freq: Every day | ORAL | Status: DC

## 2019-02-17 MED ORDER — HEPARIN SODIUM (PORCINE) 1000 UNIT/ML IJ SOLN
INTRAMUSCULAR | Status: DC | PRN
  Administered 2019-02-17 (×2): 4500 [IU] via INTRAVENOUS

## 2019-02-17 MED ORDER — ACETAMINOPHEN 325 MG PO TABS: 650.0000 mg | ORAL_TABLET | ORAL | Status: DC | PRN

## 2019-02-17 MED ORDER — NITROGLYCERIN 0.4 MG SL SUBL: 0.4000 mg | SUBLINGUAL_TABLET | SUBLINGUAL | Status: DC | PRN

## 2019-02-17 MED ORDER — LIDOCAINE HCL (PF) 1 % IJ SOLN
INTRAMUSCULAR | Status: DC | PRN
  Administered 2019-02-17: 2 mL

## 2019-02-17 MED ORDER — LABETALOL HCL 5 MG/ML IV SOLN: 10.0000 mg | INTRAVENOUS | Status: AC | PRN

## 2019-02-17 MED ORDER — MIDAZOLAM HCL 2 MG/2ML IJ SOLN
INTRAMUSCULAR | Status: AC
  Filled 2019-02-17: qty 2

## 2019-02-17 MED ORDER — FENTANYL CITRATE (PF) 100 MCG/2ML IJ SOLN
INTRAMUSCULAR | Status: AC
  Filled 2019-02-17: qty 2

## 2019-02-17 MED ORDER — ASPIRIN 300 MG RE SUPP: 300.0000 mg | RECTAL | Status: DC

## 2019-02-17 MED ORDER — HYDRALAZINE HCL 20 MG/ML IJ SOLN: 10.0000 mg | INTRAMUSCULAR | Status: AC | PRN

## 2019-02-17 MED ORDER — ASPIRIN EC 81 MG PO TBEC: 81.0000 mg | DELAYED_RELEASE_TABLET | Freq: Every day | ORAL | Status: DC

## 2019-02-17 MED ORDER — ASPIRIN 81 MG PO CHEW: 324.0000 mg | CHEWABLE_TABLET | ORAL | Status: DC

## 2019-02-17 MED ORDER — SODIUM CHLORIDE 0.9 % IV SOLN: 250.0000 mL | INTRAVENOUS | Status: DC | PRN

## 2019-02-17 MED ORDER — METOPROLOL SUCCINATE ER 25 MG PO TB24
25.0000 mg | ORAL_TABLET | Freq: Two times a day (BID) | ORAL | Status: DC
  Administered 2019-02-17: 25 mg via ORAL
  Filled 2019-02-17: qty 1

## 2019-02-17 MED ORDER — ISOSORBIDE MONONITRATE ER 30 MG PO TB24: 15.0000 mg | ORAL_TABLET | Freq: Every day | ORAL | Status: DC

## 2019-02-17 MED ORDER — NITROGLYCERIN 0.4 MG SL SUBL
0.4000 mg | SUBLINGUAL_TABLET | SUBLINGUAL | Status: DC | PRN
  Administered 2019-02-17: 0.4 mg via SUBLINGUAL
  Filled 2019-02-17: qty 1

## 2019-02-17 MED ORDER — ATORVASTATIN CALCIUM 40 MG PO TABS
40.0000 mg | ORAL_TABLET | Freq: Every evening | ORAL | Status: DC
  Administered 2019-02-17: 40 mg via ORAL
  Filled 2019-02-17: qty 1

## 2019-02-17 MED ORDER — VERAPAMIL HCL 2.5 MG/ML IV SOLN
INTRAVENOUS | Status: DC | PRN
  Administered 2019-02-17: 10 mL via INTRA_ARTERIAL

## 2019-02-17 MED ORDER — SODIUM CHLORIDE 0.9 % IV SOLN: INTRAVENOUS | Status: AC

## 2019-02-17 MED ORDER — ACETAMINOPHEN 325 MG PO TABS
650.0000 mg | ORAL_TABLET | ORAL | Status: DC | PRN
  Administered 2019-02-17: 650 mg via ORAL
  Filled 2019-02-17: qty 2

## 2019-02-17 MED ORDER — CLOPIDOGREL BISULFATE 75 MG PO TABS: 75.0000 mg | ORAL_TABLET | Freq: Every day | ORAL | Status: DC

## 2019-02-17 MED ORDER — HEPARIN (PORCINE) IN NACL 1000-0.9 UT/500ML-% IV SOLN
INTRAVENOUS | Status: DC | PRN
  Administered 2019-02-17 (×2): 500 mL

## 2019-02-17 MED ORDER — MIDAZOLAM HCL 2 MG/2ML IJ SOLN
INTRAMUSCULAR | Status: DC | PRN
  Administered 2019-02-17: 1 mg via INTRAVENOUS

## 2019-02-17 MED ORDER — SODIUM CHLORIDE 0.9 % WEIGHT BASED INFUSION: 1.0000 mL/kg/h | INTRAVENOUS | Status: DC

## 2019-02-17 MED ORDER — HEPARIN SODIUM (PORCINE) 5000 UNIT/ML IJ SOLN: 5000.0000 [IU] | Freq: Three times a day (TID) | INTRAMUSCULAR | Status: DC

## 2019-02-17 MED ORDER — ONDANSETRON HCL 4 MG/2ML IJ SOLN: 4.0000 mg | Freq: Four times a day (QID) | INTRAMUSCULAR | Status: DC | PRN

## 2019-02-17 MED ORDER — ISOSORBIDE MONONITRATE ER 30 MG PO TB24: 30.0000 mg | ORAL_TABLET | Freq: Every day | ORAL | Status: AC

## 2019-02-17 MED ORDER — LIDOCAINE HCL (PF) 1 % IJ SOLN
INTRAMUSCULAR | Status: AC
  Filled 2019-02-17: qty 30

## 2019-02-17 MED ORDER — HEPARIN SODIUM (PORCINE) 1000 UNIT/ML IJ SOLN
INTRAMUSCULAR | Status: AC
  Filled 2019-02-17: qty 1

## 2019-02-17 MED ORDER — NITROGLYCERIN 1 MG/10 ML FOR IR/CATH LAB
INTRA_ARTERIAL | Status: DC | PRN
  Administered 2019-02-17: 200 ug via INTRACORONARY

## 2019-02-17 MED ORDER — HEPARIN (PORCINE) 25000 UT/250ML-% IV SOLN
1350.0000 [IU]/h | INTRAVENOUS | Status: DC
  Administered 2019-02-17 (×2): 1350 [IU]/h via INTRAVENOUS
  Filled 2019-02-17: qty 250

## 2019-02-17 MED FILL — CLOPIDOGREL 75 MG TABLET: 75 | 30 days supply | Qty: 30 | Fill #0

## 2019-02-17 SURGICAL SUPPLY — 14 items
CATH IMPULSE 5F ANG/FL3.5 (CATHETERS) ×1 IMPLANT
CATH VISTA GUIDE 6FR XBLAD3.5 (CATHETERS) ×1 IMPLANT
DEVICE RAD COMP TR BAND LRG (VASCULAR PRODUCTS) ×1 IMPLANT
GLIDESHEATH SLEND A-KIT 6F 22G (SHEATH) ×1 IMPLANT
GLIDESHEATH SLEND SS 6F .021 (SHEATH) IMPLANT
GUIDEWIRE INQWIRE 1.5J.035X260 (WIRE) IMPLANT
GUIDEWIRE PRESSURE COMET II (WIRE) ×1 IMPLANT
INQWIRE 1.5J .035X260CM (WIRE) ×2
KIT ESSENTIALS PG (KITS) ×1 IMPLANT
KIT HEART LEFT (KITS) ×2 IMPLANT
PACK CARDIAC CATHETERIZATION (CUSTOM PROCEDURE TRAY) ×2 IMPLANT
SHEATH PROBE COVER 6X72 (BAG) ×1 IMPLANT
TRANSDUCER W/STOPCOCK (MISCELLANEOUS) ×2 IMPLANT
TUBING CIL FLEX 10 FLL-RA (TUBING) ×2 IMPLANT

## 2019-02-17 NOTE — Interval H&P Note (Signed)
Cath Lab Visit (complete for each Cath Lab visit)  Clinical Evaluation Leading to the Procedure:   ACS: Yes.    Non-ACS:    Anginal Classification: CCS IV  Anti-ischemic medical therapy: Minimal Therapy (1 class of medications)  Non-Invasive Test Results: No non-invasive testing performed  Prior CABG: No previous CABG      History and Physical Interval Note:  02/17/2019 10:44 AM  [name]  has presented today for surgery, with the diagnosis of nonstemi.  The various methods of treatment have been discussed with the patient and family. After consideration of risks, benefits and other options for treatment, the patient has consented to  Procedure(s): LEFT HEART CATH AND CORONARY ANGIOGRAPHY (N/A) as a surgical intervention.  The patient's history has been reviewed, patient examined, no change in status, stable for surgery.  I have reviewed the patient's chart and labs.  Questions were answered to the patient's satisfaction.     Kyle [name]

## 2019-02-17 NOTE — ED Provider Notes (Signed)
To ensure patient gets prompt transfer to the cardiac center, heparin drip will need to be discontinued during transport, then will be restarted once he arrives at [place], MD 02/17/19 [phone]

## 2019-02-17 NOTE — Discharge Summary (Addendum)
Discharge Summary    Patient ID: Kyle [name] MRN: [id number]; DOB: [birth year]  Admit date: 02/17/2019 Discharge date: 02/17/2019  Primary Care Provider: [facility]  Primary Cardiologist: [facility] Primary Electrophysiologist:  None   Discharge Diagnoses    Principal Problem:   Unstable angina [facility]) Active Problems:   PSVT (paroxysmal supraventricular tachycardia) (HCC)   Coronary artery disease   Hyperlipidemia  Diagnostic Studies/Procedures    Cardiac Catheterization 02/17/19  Heavily calcified proximal to mid LAD. Widely patent left main Eccentric proximal LAD 50 to 70%.  This is a chronic lesion present since 2007.  It is in a heavily calcified segment.  The stent distal to this site is widely patent.  DFR was performed on the eccentric proximal lesion and was 0.89. Ostial eccentric circumflex 50 to 70%, unchanged from prior angiography done in 2017. Moderate diffuse disease proximal to distal RCA with widely patent mid to distal stent. The left ventricular systolic function and end-diastolic pressure.  EF 60%.   RECOMMENDATIONS: The patient's symptoms have a component consistent with paroxysmal arrhythmia.  He does have borderline significant coronary substrate that could cause angina if prolonged tachycardia. Presentation does not demonstrate any EKG change or enzyme bump.  Therefore we did not intervene upon the borderline LAD calcified stenosis that has been present for greater than 10 years.  This lesion theoretically could be treated, probably best with orbital or rotational atherectomy followed by stenting.  Unlikely that stenting this will resolve the patient's current symptoms. Recommended that as a staged procedure within the next 2 weeks he undergo stenting of the proximal LAD.  He preferred to have this done at the [facility] in [place] I have started Plavix along with aspirin. Recommend prolonged monitoring Marjie Skiff clarified with Dr. Katrinka Blazing this meant event monitor, not physical monitoring post cath]    2D Echo 02/17/19 IMPRESSIONS  1. Left ventricular ejection fraction, by visual estimation, is 70 to 75%. The left ventricle has hyperdynamic function. There is no left ventricular hypertrophy.  2. The left ventricle has no regional wall motion abnormalities.  3. Global right ventricle has normal systolic function.The right ventricular size is normal. No increase in right ventricular wall thickness.  4. Left atrial size was normal.  5. Right atrial size was normal.  6. The mitral valve is normal in structure. No evidence of mitral valve regurgitation. No evidence of mitral stenosis.  7. The tricuspid valve is normal in structure.  8. The aortic valve is normal in structure. Aortic valve regurgitation is not visualized. No evidence of aortic valve sclerosis or stenosis.  9. The pulmonic valve was normal in structure. Pulmonic valve regurgitation is not visualized. 10. The inferior vena cava is normal in size with greater than 50% respiratory variability, suggesting right atrial pressure of 3 mmHg.   _____________   History of Present Illness     Kyle [name] is a 58 y.o. male with CAD s/p remote PCI to RCA, more recently PCI to mLAD 2017, HLD, PSVT, chronic back pain who presented to [facility] with chest pain.  He presented to the hospital overnight 3-4 days increasing frequency of left sided chest pain. Tonight he experienced pain that radiated into his left jaw and was associated with nausea and diaphoresis. This pain was reminiscent of the pain he felt prior to his most recent PCI in 2017. Because of these symptoms he was actually scheduled to have a stress test performed  at the [place] in the near future, but details and cardiology notes were not available via [place].   He described decreased exercise tolerance and fatigue over the past two to three months. He denied fevers,  cough, or other infectious symptoms. No LE edema, orthopnea, or PND. He also reported two pre-syncopal episodes in the setting of heavy exertion, but this had not happened in the last 2-3 months. EKG showed NSR normal axis, normal intervals, no ST changes. Initial troponin was negative. CMET and CBC were unrevealing except mild hyperglycemia of 116. A1C was normal. Rapid Covid testing was negative. He was admitted by our cardiology team for further management.   Hospital Course     He ruled out for MI. His symptoms were felt concerning for unstable angina. He therefore underwent cardiac cath with findings above. There is no high-grade obstructive disease that would cause pain at rest. Anatomy is very similar to 2017 following mid LAD stent. The proximal eccentric calcified LAD stenosis is in the 50 to 70% range and does have a borderline significant DFR. Dr. Tamala Julian recommended treatment outlined above. This was discussed with the patient and he preferred to have it done at the [place] in [facility]. Dr. Tamala Julian also discussed that the lesion is probably not what is causing his symptoms. He also suggested the possibility that he could be having a rhythm abnormality causing his symptoms and he may benefit from a prolonged period of monitoring to identify the problem. The patient wishes to reach out to his primary cardiologist at the [facility] to discuss further workup including monitor and cath. We have given him our office phone number in case he needs to reach [place] or decides he wants to instead schedule an appointment with [place]. I will also send a message to our office to call and check on him on Monday.   In anticipation of recommended future PCI, Dr. Tamala Julian recommended starting Plavix along with aspirin. Dr. Gwenlyn Found also recommended to increase his Imdur from 15mg  to 30mg  daily. The patient will receive Plavix prior to discharge via the [place]. He did not want [place] to send in the Imdur rx since he believes he already  has this at home. Dr. Gwenlyn Found has seen and examined the patient today and feels he is stable for discharge. He was observed post-cath without complication. Will give copy of discharge summary as well so he can take to the [place].  Did the patient have an acute coronary syndrome (MI, NSTEMI, STEMI, etc) this admission?:  No                               Did the patient have a percutaneous coronary intervention (stent / angioplasty)?:  No.   _____________  Discharge Vitals Blood pressure 103/90, pulse 70, temperature 98 F (36.7 C), temperature source Oral, resp. rate 17, height 6' (1.829 m), weight 98.2 kg, SpO2 96 %.  Filed Weights   02/16/19 2222 02/17/19 0420  Weight: 95.3 kg 98.2 kg    Labs & Radiologic Studies    CBC Recent Labs    02/16/19 2306 02/17/19 1307  WBC 8.6 9.5  HGB 15.5 15.0  HCT 48.1 44.5  MCV 90.4 89.0  PLT 240 527   Basic Metabolic Panel Recent Labs    02/16/19 2306 02/17/19 1000  NA 136 141  K 3.6 3.9  CL 102 111  CO2 22 24  GLUCOSE 142* 97  BUN  17 12  CREATININE 0.92 0.86  CALCIUM 8.8* 8.8*   High Sensitivity Troponin:   Recent Labs  Lab 02/08/19 1032 02/16/19 2306 02/17/19 0106 02/17/19 0523 02/17/19 0635  TROPONINIHS <2.0 Hemoglobin A1C Recent Labs    02/17/19 0523  HGBA1C 5.5   _____________  DG Chest 2 View  Result Date: 02/16/2019 CLINICAL DATA:  58 year old male with chest pain. EXAM: CHEST - 2 VIEW COMPARISON:  Chest radiograph dated 02/08/2019. FINDINGS: The lungs are clear. There is no pleural effusion or pneumothorax. The cardiac silhouette is within normal limits. No acute osseous pathology. IMPRESSION: No active cardiopulmonary disease. Electronically Signed   By: Elgie Collard M.D.   On: 02/16/2019 22:53   DG Chest 2 View  Result Date: 02/08/2019 CLINICAL DATA:  Tachycardia.  Chest pain. EXAM: CHEST - 2 VIEW COMPARISON:  03/25/2006 FINDINGS: Artifact overlies the chest. Heart size is normal. Mediastinal  shadows are normal. Lungs are clear. The vascularity is normal. No effusions. No significant bone finding. IMPRESSION: No active cardiopulmonary disease. Electronically Signed   By: Paulina Fusi M.D.   On: 02/08/2019 08:38   CARDIAC CATHETERIZATION  Result Date: 02/17/2019  Heavily calcified proximal to mid LAD.  Widely patent left main  Eccentric proximal LAD 50 to 70%.  This is a chronic lesion present since 2007.  It is in a heavily calcified segment.  The stent distal to this site is widely patent.  DFR was performed on the eccentric proximal lesion and was 0.89.  Ostial eccentric circumflex 50 to 70%, unchanged from prior angiography done in 2017.  Moderate diffuse disease proximal to distal RCA with widely patent mid to distal stent.  The left ventricular systolic function and end-diastolic pressure.  EF 60%. RECOMMENDATIONS:  The patient's symptoms have a component consistent with paroxysmal arrhythmia.  He does have borderline significant coronary substrate that could cause angina if prolonged tachycardia.  Presentation does not demonstrate any EKG change or enzyme bump.  Therefore we did not intervene upon the borderline LAD calcified stenosis that has been present for greater than 10 years.  This lesion theoretically could be treated, probably best with orbital or rotational atherectomy followed by stenting.  Unlikely that stenting this will resolve the patient's current symptoms.  Recommended that as a staged procedure within the next 2 weeks he undergo stenting of the proximal LAD.  He preferred to have this done at the [facility] in [place]  I have started Plavix along with aspirin.  Recommend prolonged monitoring.  ECHOCARDIOGRAM COMPLETE  Result Date: 02/17/2019   ECHOCARDIOGRAM REPORT   Patient Name:   Kyle [name] Date of Exam: 02/17/2019 Medical Rec #:  [id number]        Height:       72.0 in Accession #:    [id number]       Weight:       216.5 lb Date of  Birth:  [birth year]        BSA:          2.20 m Patient Age:    58 years         BP:           119/70 mmHg Patient Gender: M                HR:           81 bpm. Exam Location:  Inpatient Procedure: 2D Echo, Cardiac Doppler and Color Doppler Indications:  R07.9* Chest pain, unspecified  History:        Patient has prior history of Echocardiogram examinations, most                 recent 03/06/2011. CAD, Abnormal ECG; Signs/Symptoms:Chest Pain                 and Dyspnea.  Sonographer:    Sheralyn Boatman RDCS Referring Phys: [id number] Laurell Josephs  Sonographer Comments: Rushed through exam because cath was ready to take patient. IMPRESSIONS  1. Left ventricular ejection fraction, by visual estimation, is 70 to 75%. The left ventricle has hyperdynamic function. There is no left ventricular hypertrophy.  2. The left ventricle has no regional wall motion abnormalities.  3. Global right ventricle has normal systolic function.The right ventricular size is normal. No increase in right ventricular wall thickness.  4. Left atrial size was normal.  5. Right atrial size was normal.  6. The mitral valve is normal in structure. No evidence of mitral valve regurgitation. No evidence of mitral stenosis.  7. The tricuspid valve is normal in structure.  8. The aortic valve is normal in structure. Aortic valve regurgitation is not visualized. No evidence of aortic valve sclerosis or stenosis.  9. The pulmonic valve was normal in structure. Pulmonic valve regurgitation is not visualized. 10. The inferior vena cava is normal in size with greater than 50% respiratory variability, suggesting right atrial pressure of 3 mmHg. FINDINGS  Left Ventricle: Left ventricular ejection fraction, by visual estimation, is 70 to 75%. The left ventricle has hyperdynamic function. The left ventricle has no regional wall motion abnormalities. There is no left ventricular hypertrophy. Normal left atrial pressure. Right Ventricle: The right ventricular size is  normal. No increase in right ventricular wall thickness. Global RV systolic function is has normal systolic function. Left Atrium: Left atrial size was normal in size. Right Atrium: Right atrial size was normal in size Pericardium: There is no evidence of pericardial effusion. Mitral Valve: The mitral valve is normal in structure. No evidence of mitral valve regurgitation. No evidence of mitral valve stenosis by observation. Tricuspid Valve: The tricuspid valve is normal in structure. Tricuspid valve regurgitation is not demonstrated. Aortic Valve: The aortic valve is normal in structure. Aortic valve regurgitation is not visualized. The aortic valve is structurally normal, with no evidence of sclerosis or stenosis. Pulmonic Valve: The pulmonic valve was normal in structure. Pulmonic valve regurgitation is not visualized. Pulmonic regurgitation is not visualized. Aorta: The aortic root, ascending aorta and aortic arch are all structurally normal, with no evidence of dilitation or obstruction. Venous: The inferior vena cava is normal in size with greater than 50% respiratory variability, suggesting right atrial pressure of 3 mmHg. IAS/Shunts: No atrial level shunt detected by color flow Doppler. There is no evidence of a patent foramen ovale. No ventricular septal defect is seen or detected. There is no evidence of an atrial septal defect.  LEFT VENTRICLE PLAX 2D LVIDd:         4.50 cm       Diastology LVIDs:         2.50 cm       LV e' lateral:   9.14 cm/s LV PW:         1.20 cm       LV E/e' lateral: 9.7 LV IVS:        1.10 cm       LV e' medial:    5.98 cm/s LVOT  diam:     1.90 cm       LV E/e' medial:  14.9 LV SV:         70 ml LV SV Index:   31.10 LVOT Area:     2.84 cm  LV Volumes (MOD) LV area d, A2C:    29.00 cm LV area d, A4C:    29.50 cm LV area s, A2C:    14.50 cm LV area s, A4C:    14.00 cm LV major d, A2C:   7.77 cm LV major d, A4C:   8.07 cm LV major s, A2C:   6.11 cm LV major s, A4C:   7.12 cm LV  vol d, MOD A2C: 89.9 ml LV vol d, MOD A4C: 88.5 ml LV vol s, MOD A2C: 29.8 ml LV vol s, MOD A4C: 23.3 ml LV SV MOD A2C:     60.1 ml LV SV MOD A4C:     88.5 ml LV SV MOD BP:      62.3 ml RIGHT VENTRICLE             IVC RV S prime:     13.20 cm/s  IVC diam: 1.20 cm TAPSE (M-mode): 2.4 cm LEFT ATRIUM             Index       RIGHT ATRIUM           Index LA diam:        3.60 cm 1.63 cm/m  RA Area:     14.70 cm LA Vol (A2C):   41.2 ml 18.70 ml/m RA Volume:   33.50 ml  15.20 ml/m LA Vol (A4C):   56.9 ml 25.82 ml/m LA Biplane Vol: 52.0 ml 23.60 ml/m  AORTIC VALVE LVOT Vmax:   114.00 cm/s LVOT Vmean:  69.700 cm/s LVOT VTI:    0.250 m  AORTA Ao Root diam: 3.70 cm Ao Asc diam:  3.40 cm MITRAL VALVE MV Area (PHT): 5.13 cm    SHUNTS MV PHT:        42.92 msec  Systemic VTI:  0.25 m MV Decel Time: 148 msec    Systemic Diam: 1.90 cm MV E velocity: 89.10 cm/s MV A velocity: 66.00 cm/s MV E/A ratio:  1.35  Mihai Croitoru MD Electronically signed by Thurmon Fair MD Signature Date/Time: 02/17/2019/12:10:31 PM    Final    Disposition   Pt is being discharged home today in good condition.  Follow-up Plans & Appointments   Follow-[facility] Information     [facility]   Why: Please call your care provider at the [facility] to discuss plan for early follow-up. Dr. Katrinka Blazing recommends they consider doing an angioplasty procedure and a heart monitor. We have given you a copy of our discharge summary and recommendations.        Lyn Records, MD Follow up.   Specialty: Cardiology Why: Please call our cardiology office if you have any questions. Contact information: [address] [place] [address] [phone]           Discharge Instructions     Diet - low sodium heart healthy   Complete by: As directed    Discharge instructions   Complete by: As directed    You were started on a new blood thinner called clopidogrel (Plavix) to take in addition to your baby aspirin. This is in anticipation  that you may benefit from a subsequent angioplasty procedure.   Dr. Allyson Sabal recommends  you take isosorbide mononitrate (Imdur) 30mg  once daily so let us know if you discover you do not have this medication at home.  If you notice any bleeding such as blood in stool, black tarry stools, blood in urine, nosebleeds or any other unusual bleeding, call your doctor immediately. It is not normal to have this kind of bleeding while on a blood thinner and usually indicates there is an underlying problem with one of your body systems that needs to be checked out.   Increase activity slowly   Complete by: As directed    No driving for 2 days. No lifting over 5-10 lbs for 1 week. No sexual activity for 1 week. Keep procedure site clean & dry. If you notice increased pain, swelling, bleeding or pus, call/return!  You may shower, but no soaking baths/hot tubs/pools for 1 week.       Discharge Medications   Allergies as of 02/17/2019   No Known Allergies      Medication List     TAKE these medications    aspirin EC 81 MG tablet Take 81 mg by mouth daily. Notes to patient: 02/18/19   atorvastatin 40 MG tablet Commonly known as: LIPITOR Take 1 tablet (40 mg total) by mouth every evening. Notes to patient: 02/18/19 Evening   B-complex with vitamin C tablet Take 1 tablet by mouth daily. Notes to patient: 02/18/19   clopidogrel 75 MG tablet Commonly known as: Plavix Take 1 tablet (75 mg total) by mouth daily. Notes to patient: 02/18/19   finasteride 1 MG tablet Commonly known as: PROPECIA Take 1 mg by mouth daily. Notes to patient: 02/18/19   fluticasone 50 MCG/ACT nasal spray Commonly known as: FLONASE Place 2 sprays into both nostrils daily as needed for allergies or rhinitis.   GINGER EXTRACT PO Take 1 capsule by mouth 2 (two) times daily. Notes to patient: 02/18/19   isosorbide mononitrate 30 MG 24 hr tablet Commonly known as: IMDUR Take 1 tablet (30 mg total) by mouth  daily. What changed:  how much to take how to take this when to take this additional instructions Notes to patient: 02/18/19   metoprolol succinate 25 MG 24 hr tablet Commonly known as: TOPROL-XL Take 1 tablet (25 mg total) by mouth 2 (two) times daily. Notes to patient: 02/18/19   nitroGLYCERIN 0.4 MG SL tablet Commonly known as: NITROSTAT Place 1 tablet (0.4 mg total) under the tongue every 5 (five) minutes as needed for chest pain.   Salmon Oil-1000 200 MG Caps Take 1 capsule by mouth daily. Notes to patient: 02/18/19   VITAMIN A PO Take 1 capsule by mouth daily. Notes to patient: 02/18/19   VITAMIN D (ERGOCALCIFEROL) PO Take 1 capsule by mouth daily. Notes to patient: 02/18/19   VITAMIN E PO Take 1 capsule by mouth daily. Notes to patient: 02/18/19          Outstanding Labs/Studies   N/A  Duration of Discharge Encounter   Greater than 30 minutes including physician time.  Signed, Laurann Montanaayna N Dunn, PA-C 02/17/2019, 5:39 PM   Agree with findings by Ronie Spiesayna Dunn PA-C  Kyle [name] is ready for discharge.  He was admitted with chest pain with known prior CAD.  He had complicated mid LAD stenting by Dr. Herbie BaltimoreHarding in 2017 with a prior RCA stent that was patent and moderate ostial circumflex disease with normal LV function.  His enzymes were low and flat and his EKG showed no acute changes.  Dr. Katrinka BlazingSmith performed  coronary angiography revealing unchanged anatomy.  There was a 50 to 70% proximal LAD lesion that was positive by DFR but would require orbital atherectomy for stenting given heavy calcification.  The patient wished to have any intervention performed at the [facility]  It was decided to treat him medically by uptitrating his long-acting oral nitrate.  We will see him back as needed.   Runell Gess, M.D., FACP, [facility], [facility] [address] [place], [place]  [address]  [phone] 02/18/2019 7:32 AM

## 2019-02-17 NOTE — Progress Notes (Signed)
Pt ambulated without complications, no SOB or chest pain. However, when he walk, showed VT on the monitor. Called cardiac team and it was artifacts. Patient may go home tonight. HS [employer]

## 2019-02-17 NOTE — Progress Notes (Signed)
[facility] for Heparin Indication: chest pain/ACS  No Known Allergies  Patient Measurements: Height: 6' (182.9 cm) Weight: 216 lb 7.9 oz (98.2 kg) IBW/kg (Calculated) : 77.6  Vital Signs: Temp: 98 F (36.7 C) (12/24 1219) Temp Source: Oral (12/24 1219) BP: 123/74 (12/24 1219) Pulse Rate: 69 (12/24 1219)  Labs: Recent Labs    02/16/19 2306 02/17/19 0106 02/17/19 0523 02/17/19 0635 02/17/19 1000  HGB 15.5  --   --   --   --   HCT 48.1  --   --   --   --   PLT 240  --   --   --   --   HEPARINUNFRC  --   --   --   --  0.58  CREATININE 0.92  --   --   --  0.86  TROPONINIHS 2 3 3 4   --     Estimated Creatinine Clearance: 113.6 mL/min (by C-G formula based on SCr of 0.86 mg/dL).   Medical History: Past Medical History:  Diagnosis Date  . Chronic back pain   . Coronary artery disease    s/p RCA PCI with a drug eluting stent  . Hyperlipidemia   . PSVT (paroxysmal supraventricular tachycardia) (HCC)     Medications:  See electronic med rec  Assessment: 57 y.o. M presents with CP. To being heparin for r/o ACS. No AC PTA. CBC ok on admission.   Heparin level at goal this AM, then turned off for cath.    Goal of Therapy:  Heparin level 0.3-0.7 units/ml Monitor platelets by anticoagulation protocol: Yes   Plan:  No plans to resume heparin post cath. Pharmacy will sign-off, please contact if questions.  Marguerite Olea, [facility] Clinical Pharmacist Phone [phone]  02/17/2019 12:42 PM

## 2019-02-17 NOTE — Progress Notes (Addendum)
   Fellow saw this patient around 5am. Patient presented with increasing chest pain over the last 3-4 days. Associated shortness of breath, nausea, and diaphoresis. Pain occurs at both rest and with activity but does not seem to be worse with activity. During episode last night, he had radiation to left jaw. Symptoms are similar to those prior to previous stenting.  Patient has also had decreased exercise tolerance and fatigue recently.  High-sensitivity troponin negative x3. EKG shows not acute changes.   However, given accelerating angina, think patient likely needs a cardiac catheterization. The patient understands that risks include but are not limited to stroke (1 in 1000), death (1 in 37), kidney failure [usually temporary] (1 in 500), bleeding (1 in 200), allergic reaction [possibly serious] (1 in 200), and agrees to proceed.   Dr. Gwenlyn Found to see first thing this morning.   Kyle Mclean, PA-C 02/17/2019 7:35 AM  Agree with note by Sande Rives, PA-C  Kyle [name] was transferred from [facility] with unstable angina.  He was seen by Dr. Marletta Lor this morning.  He is on IV heparin.  He does have a history of remote RCA stenting in 2007 and mid LAD stenting by Dr. Ellyn Hack in 2017.  He describes this as a complex procedure requiring a body wire and a guide liner.  He did have ostial circumflex disease as well at that time.  He has had new onset symptoms of probably 2 weeks ago with tachypalpitations.  He says history of SVT ablation by Dr. Lovena Le in 2014.  The chest pain was more pronounced yesterday with radiation to his left jaw, relieved with sublingual nitroglycerin.  Is currently pain-free.  His enzymes are negative.  EKG shows no acute changes.  He did have a Covid 19 test [facility] which has not yet resulted.  He will need diagnostic coronary angiography today to define his anatomy. I have reviewed the risks, indications, and alternatives to cardiac catheterization,  possible angioplasty, and stenting with the patient. Risks include but are not limited to bleeding, infection, vascular injury, stroke, myocardial infection, arrhythmia, kidney injury, radiation-related injury in the case of prolonged fluoroscopy use, emergency cardiac surgery, and death. The patient understands the risks of serious complication is 1-2 in 5366 with diagnostic cardiac cath and 1-2% or less with angioplasty/stenting.    Lorretta Harp, M.D., [place], [facility], [place] [address] [place], [place]  [address]  [phone] 02/17/2019 7:59 AM

## 2019-02-17 NOTE — CV Procedure (Signed)
   Coronary angiography via right radial arterial access.  Heavily calcified left coronary.  Eccentric 50 to 70% stenosis that is been present and stable since 2007.  DFR 0.89 resting and 0.84 after contrast injection.  Widely patent mid LAD stent.  Widely patent left main.  50% ostial circumflex.  This lesion is also visually stable compared to prior.  Widely patent mid RCA stent with 20 to 30% ISR.  Normal left ventricular function.  LVEF 60%.  Normal LVEDP.  Impression: No high-grade obstructive disease that would cause pain at rest.  Anatomy is very similar to 2017 following mid LAD stent.  The proximal eccentric calcified LAD stenosis is in the 50 to 70% range and does have a borderline significant DFR.  I would recommend treatment with orbital or rotational atherectomy to ensure good stent expansion.  This was discussed with the patient and he preferred to have it done at the [place] in [facility].  Also discussed that the lesion is probably not what is causing his symptoms.  At this time it seems likely that he is having an arrhythmia and he may need to have a prolonged period of monitoring to identify the problem.  In anticipation of PCI, recommend starting Plavix along with aspirin.

## 2019-02-17 NOTE — H&P (Signed)
Cardiology Admission History and Physical:   Patient ID: Kyle [name] MRN: [id number]; DOB: August 10, 1960   Admission date: 02/17/2019  Primary [facility] Provider: [facility] Primary Cardiologist: No primary care provider on file.  Primary Electrophysiologist:  None   Chief Complaint:  Chest pain  HPI:   Kyle [name] is a 58 y.o. male with known CAD s/p PCI to mLAD 2017 who presents with 3-4 days increasing frequency of left sided chest pain. Tonight he experienced pain that radiated into his left jaw and was associated with nausea and diaphoresis. This pain was reminiscent of the pain he felt prior to his most recent PCI in 2017. Because of these symptoms he was actually scheduled to have a stress test performed at the [place] in the near future, but details and cardiology notes are not available via Care Everywhere.   He also describes decreased exercise tolerance and fatigue over the past two to three months. He denies fevers, cough, or other infectious symptoms. No LE edema, orthopnea, or PND. He does describe two pre-syncopal episodes in the setting of heavy exertion, but this has not happened in the last 2-3 months.   Current cardiovascular medications include metoprolol 25mg  BID, ASA, and atorvastatin.   Heart Pathway Score:  HEAR Score: 5  Past Medical History:  Diagnosis Date   Chronic back pain    Coronary artery disease    s/p RCA PCI with a drug eluting stent   Hyperlipidemia    PSVT (paroxysmal supraventricular tachycardia) (HCC)     Past Surgical History:  Procedure Laterality Date   CARDIAC CATHETERIZATION  2008   mild nonobstructive CAD (40% proximal LAD stenosis). patent mid RCA stent   CARDIAC CATHETERIZATION N/A 09/14/2015   Procedure: Left Heart Cath and Coronary Angiography;  Surgeon: 09/16/2015, MD;  Location: [facility] INVASIVE CV LAB;  Service: Cardiovascular;  Laterality: N/A;   CARDIAC CATHETERIZATION N/A 09/14/2015   Procedure: Coronary  Stent Intervention;  Surgeon: 09/16/2015, MD;  Location[facility] INVASIVE CV LAB;  Service: Cardiovascular;  Laterality: N/A;   CORONARY STENT PLACEMENT     mid RCA: 3.5 X 16 mm Taxus DES   LEG SURGERY     SUPRAVENTRICULAR TACHYCARDIA ABLATION N/A 07/02/2012   Procedure: SUPRAVENTRICULAR TACHYCARDIA ABLATION;  Surgeon: 09/01/2012, MD;  Location: [facility] CATH LAB;  Service: Cardiovascular;  Laterality: N/A;     Medications Prior to Admission: Prior to Admission medications   Medication Sig Start Date End Date Taking? Authorizing Provider  aspirin EC 81 MG tablet Take 81 mg by mouth daily.     Yes [provider]  atorvastatin (LIPITOR) 40 MG tablet Take 1 tablet (40 mg total) by mouth every evening. 05/23/15  Yes 05/25/15, MD  B Complex-C (B-COMPLEX WITH VITAMIN C) tablet Take 1 tablet by mouth daily.   Yes [provider]  finasteride (PROPECIA) 1 MG tablet Take 1 mg by mouth daily. 01/14/19  Yes [provider]  fluticasone (FLONASE) 50 MCG/ACT nasal spray Place 2 sprays into both nostrils daily as needed for allergies or rhinitis.   Yes [provider]  Ginger, Zingiber officinalis, (GINGER EXTRACT PO) Take 1 capsule by mouth 2 (two) times daily.   Yes [provider]  metoprolol succinate (TOPROL-XL) 25 MG 24 hr tablet Take 1 tablet (25 mg total) by mouth 2 (two) times daily. 05/23/15  Yes 05/25/15, MD  Omega-3 Fatty Acids (SALMON OIL-1000) 200 MG CAPS Take 1 capsule  by mouth daily.   Yes [provider]  VITAMIN A PO Take 1 capsule by mouth daily.   Yes [provider]  VITAMIN D, ERGOCALCIFEROL, PO Take 1 capsule by mouth daily.   Yes [provider]  VITAMIN E PO Take 1 capsule by mouth daily.   Yes [provider]  isosorbide mononitrate (IMDUR) 30 MG 24 hr tablet Take one half a tablet at bedtime 02/08/19   Milton Ferguson, MD  nitroGLYCERIN (NITROSTAT) 0.4 MG SL tablet Place 1 tablet  (0.4 mg total) under the tongue every 5 (five) minutes as needed for chest pain. 09/16/15   Consuelo Pandy, PA-C     Allergies:   No Known Allergies  Social History:   Social History   Socioeconomic History   Marital status: Widowed    Spouse name: Not on file   Number of children: Not on file   Years of education: Not on file   Highest education level: Not on file  Occupational History    Employer: [employer]  Tobacco Use   Smoking status: Former Smoker    Packs/day: 0.75    Years: 15.00    Pack years: 11.25    Types: Cigarettes    Start date: 10/11/1983    Quit date: 02/24/1998    Years since quitting: 20.9   Smokeless tobacco: Former Systems developer    Types: Snuff    Quit date: 10/25/2013   Tobacco comment: dip occasional on weekend  Substance and Sexual Activity   Alcohol use: No    Alcohol/week: 0.0 standard drinks   Drug use: No   Sexual activity: Not on file  Other Topics Concern   Not on file  Social History Narrative   Not on file   Social Determinants of Health   Financial Resource Strain:    Difficulty of Paying Living Expenses: Not on file  Food Insecurity:    Worried About Charity fundraiser in the Last Year: Not on file   [employer] of Food in the Last Year: Not on file  Transportation Needs:    Lack of Transportation (Medical): Not on file   Lack of Transportation (Non-Medical): Not on file  Physical Activity:    Days of Exercise per Week: Not on file   Minutes of Exercise per Session: Not on file  Stress:    Feeling of Stress : Not on file  Social Connections:    Frequency of Communication with Friends and Family: Not on file   Frequency of Social Gatherings with Friends and Family: Not on file   Attends Religious Services: Not on file   Active Member of Clubs or Organizations: Not on file   Attends Archivist Meetings: Not on file   Marital Status: Not on file  Intimate Partner Violence:    Fear of  Current or Ex-Partner: Not on file   Emotionally Abused: Not on file   Physically Abused: Not on file   Sexually Abused: Not on file    Family History:   The patient's family history includes AAA (abdominal aortic aneurysm) in an other family member; CAD in his father; Epilepsy in an other family member; Other in his father and son.    ROS:  Please see the history of present illness.  All other ROS reviewed and negative.     Physical Exam/Data:   Vitals:   02/17/19 0200 02/17/19 0230 02/17/19 0300 02/17/19 0420  BP: 111/73  116/75 138/87  Pulse: 68  72 67 69  Resp: Temp:    98.1 F (36.7 C)  TempSrc:    Oral  SpO2: 95% 100% 99% 96%  Weight:    98.2 kg  Height:    6' (1.829 m)    Intake/Output Summary (Last 24 hours) at 02/17/2019 0506 Last data filed at 02/17/2019 0500 Gross per 24 hour  Intake 71.76 ml  Output --  Net 71.76 ml   Last 3 Weights 02/17/2019 02/16/2019 02/08/2019  Weight (lbs) 216 lb 7.9 oz 210 lb 210 lb  Weight (kg) 98.2 kg 95.255 kg 95.255 kg     Body mass index is 29.36 kg/m.  General:  Well nourished, well developed, in no acute distress HEENT: normal Neck: no JVD Vascular: No carotid bruits; FA pulses 2+ bilaterally without bruits  Cardiac:  normal S1, S2; RRR; no murmur  Lungs:  clear to auscultation bilaterally, no wheezing, rhonchi or rales  Abd: soft, nontender, no hepatomegaly  Ext: no edema Musculoskeletal:  No deformities, BUE and BLE strength normal and equal Skin: warm and dry  Psych:  Normal affect   EKG:  The ECG that was done was personally reviewed and demonstrates SR normal axis, normal intervals, no ST changes.   Relevant CV Studies: LHC 08/2015: 1. Mid LAD lesion, 85% stenosed. Post complicated intervention with a integrity resolute DES 2.75 mm x 12 mm (3.2 mm), there is a 0% residual stenosis. 2. Prox LAD lesion, 40% stenosed. 3. Mid RCA stent, 30% in-stent restenosis. The lesion was previously treated with a  stent (unknown type) greater than two years ago. 4. Mid RCA to Dist RCA lesion, 40% stenosed following the stent. 5. 1st Mrg lesion, 45% stenosed. 6. The left ventricular systolic function is normal.    Likely culprit lesion was the significant focal lesion in the mid LAD treated with an Integrity Resolute DES stent. Unable to advance/deploy Promus DES stent.  Otherwise minimal CAD.  Plan:  Transfer to 6 central Postprocedure Unit for TR band removal and overnight monitoring.  Anticipate discharge tomorrow.  Dual antiplatelet therapy for minimum of 3 months, could then stop aspirin if necessary after third month. Would then continue Brilinta versus Plavix.  Continue aggressive risk factor modification.  Laboratory Data:  High Sensitivity Troponin:   Recent Labs  Lab 02/08/19 0800 02/08/19 1032 02/16/19 2306 02/17/19 0106  TROPONINIHS 3 <2.0 2 3      Chemistry Recent Labs  Lab 02/16/19 2306  NA 136  K 3.6  CL 102  CO2 22  GLUCOSE 142*  BUN 17  CREATININE 0.92  CALCIUM 8.8*  GFRNONAA >60  GFRAA >60  ANIONGAP 12    No results for input(s): PROT, ALBUMIN, AST, ALT, ALKPHOS, BILITOT in the last 168 hours. Hematology Recent Labs  Lab 02/16/19 2306  WBC 8.6  RBC 5.32  HGB 15.5  HCT 48.1  MCV 90.4  MCH 29.1  MCHC 32.2  RDW 12.7  PLT 240   BNPNo results for input(s): BNP, PROBNP in the last 168 hours.  DDimer No results for input(s): DDIMER in the last 168 hours.   Radiology/Studies:  DG Chest 2 View  Result Date: 02/16/2019 CLINICAL DATA:  58 year old male with chest pain. EXAM: CHEST - 2 VIEW COMPARISON:  Chest radiograph dated 02/08/2019. FINDINGS: The lungs are clear. There is no pleural effusion or pneumothorax. The cardiac silhouette is within normal limits. No acute osseous pathology. IMPRESSION: No active cardiopulmonary disease. Electronically Signed   By: Burtis Junes  Radparvar M.D.   On: 02/16/2019 22:53       TIMI Risk Score for Unstable  Angina or Non-ST Elevation MI:   The patient's TIMI risk score is 3, which indicates a 13% risk of all cause mortality, new or recurrent myocardial infarction or need for urgent revascularization in the next 14 days.   Assessment and Plan:   Mr. Darl HouseholderScearce is a 58 year old gentleman with known CAD who presents with increasing frequency and severity of left sided chest pain over the past 3-4 days. Symptoms similar to prior to his most recent PCI and concerning for unstable angina.   #Unstable Angina -- ASA 81mg  daily -- Heparin gtt ACS dose -- Complete echo ordered -- Risk stratify with hba1c and lipid panel -- Will trend hsTn x 2 -- NPO for possible LHC -- [facility] -- Continue home metoprolol 25mg  BID -- Will add Imdur 15mg  qHS while in house ([facility] but not [facility]).   Severity of Illness: The appropriate patient status for this patient is OBSERVATION. Observation status is judged to be reasonable and necessary in order to provide the required intensity of service to ensure the patient's safety. The patient's presenting symptoms, physical exam findings, and initial radiographic and laboratory data in the context of their medical condition is felt to place them at decreased risk for further clinical deterioration. Furthermore, it is anticipated that the patient will be medically stable for discharge from the hospital within 2 midnights of admission. The following factors support the patient status of observation.   " The patient's presenting symptoms include chest pain. " The physical exam findings include n/a " The initial radiographic and laboratory data are reassuring   For questions or updates, please contact [facility] Please consult [email or web address] for contact info under   Signed, Laurell JosephsLauren K Tayah Idrovo, MD  02/17/2019 5:06 AM

## 2019-02-17 NOTE — Telephone Encounter (Signed)
   Hi triage team,  This is a patient that was admitted for chest pain and underwent cardiac catheterization. This demonstrated a borderline blockage. Dr. Tamala Julian recommended to consider angioplasty, but the patient wished to have this done at the [facility] instead. Dr. Tamala Julian also recommended he have an event monitor as an outpatient. The patient also wished to review with the [place]. He was offered the opportunity to follow-up with our office but wanted to check with his [place] team first. Going into the holidays, I just don't want this patient to be discharged without a clear-cut follow-up plan.  I'm not sure he knows whether or not he is allowed to follow up with our office if he also sees the [place]. Could you please call him on Monday to check on him to find out if there are any updates in the plan? If he decides to follow up with [place], would need a 30 day event monitor for chest pain and history of SVT, and a virtual appointment in 1 week to discuss proceeding with staged cath. Otherwise if he finds that the [place] can see him early in follow-up and consider a monitor, that is fine too.  Anthonny Schiller PA-C

## 2019-02-17 NOTE — Progress Notes (Signed)
Patient discharged at 12/24 @ 2055.  Provided and discussed all discharge information with patient and patient's fiancee. Both demonstrated and verbalized an understanding of the education.  All patient belongings and medications were sent home with the patient.  Neither patient or family had any further questions.

## 2019-02-17 NOTE — ED Notes (Signed)
ED Provider at bedside. 

## 2019-02-17 NOTE — Progress Notes (Signed)
ANTICOAGULATION CONSULT NOTE - Initial Consult  Pharmacy Consult for Heparin Indication: chest pain/ACS  No Known Allergies  Patient Measurements: Height: 6' (182.9 cm) Weight: 210 lb (95.3 kg) IBW/kg (Calculated) : 77.6  Vital Signs: Temp: 98 F (36.7 C) (12/23 2229) Temp Source: Oral (12/23 2229) BP: 135/80 (12/24 0100) Pulse Rate: 71 (12/24 0100)  Labs: Recent Labs    02/16/19 2306  HGB 15.5  HCT 48.1  PLT 240  CREATININE 0.92  TROPONINIHS 2    Estimated Creatinine Clearance: 104.9 mL/min (by C-G formula based on SCr of 0.92 mg/dL).   Medical History: Past Medical History:  Diagnosis Date  . Chronic back pain   . Coronary artery disease    s/p RCA PCI with a drug eluting stent  . Hyperlipidemia   . PSVT (paroxysmal supraventricular tachycardia) (HCC)     Medications:  See electronic med rec  Assessment: 58 y.o. M presents with CP. To being heparin for r/o ACS. No AC PTA. CBC ok on admission.   Goal of Therapy:  Heparin level 0.3-0.7 units/ml Monitor platelets by anticoagulation protocol: Yes   Plan:  Heparin IV bolus 4000 units Heparin gtt at 1350 units/hr Will f/u heparin level in 6 hours Daily heparin level and CBC  Sherlon Handing, PharmD, BCPS Please see amion for complete clinical pharmacist phone list 02/17/2019,1:13 AM

## 2019-02-17 NOTE — ED Provider Notes (Signed)
[facility] EMERGENCY DEPARTMENT Provider Note   CSN: [id number] Arrival date & time: 02/16/19  2214     History Chief Complaint  Patient presents with  . Chest Pain    Marcha DuttonDonnie K Asbury is a 58 y.o. male.  The history is provided by the patient.  Chest Pain Pain location:  Substernal area and L chest Pain quality: pressure   Pain radiates to:  Neck and L jaw Pain severity:  Severe Onset quality:  Sudden Timing:  Constant Progression:  Improving Chronicity:  New Relieved by:  None tried Worsened by:  Nothing Associated symptoms: diaphoresis, nausea and shortness of breath   Associated symptoms: no abdominal pain, no cough, no fever and no syncope   Risk factors: coronary artery disease and male sex     HPI: A 58 year old patient with a history of hypercholesterolemia presents for evaluation of chest pain. Initial onset of pain was approximately 3-6 hours ago. The patient's chest pain is described as heaviness/pressure/tightness and is not worse with exertion. The patient complains of nausea and reports some diaphoresis. The patient's chest pain is middle- or left-sided, is not well-localized, is not sharp and does radiate to the arms/jaw/neck. The patient has no history of stroke, has no history of peripheral artery disease, has not smoked in the past 90 days, denies any history of treated diabetes, has no relevant family history of coronary artery disease (first degree relative at less than age 58), is not hypertensive and does not have an elevated BMI (>=30).    Patient reports this episode came on at rest and with severe, associated shortness of breath and diaphoresis.  Is now improved but still has some chest pressure.  He was seen in the ER recently for similar episode.  He reports increasing frequency of this pain. He was to start Imdur, but has never received it from [facility] Past Medical History:  Diagnosis Date  . Chronic back pain   . Coronary artery disease     s/p RCA PCI with a drug eluting stent  . Hyperlipidemia   . PSVT (paroxysmal supraventricular tachycardia) [facility](HCC)     Patient Active Problem List   Diagnosis Date Noted  . Coronary artery disease involving native coronary artery of native heart with unstable angina pectoris (HCC) 09/14/2015  . Unstable angina (HCC) 09/14/2015  . Chest pain 09/13/2015  . GERD (gastroesophageal reflux disease) 07/11/2014  . Coronary artery disease   . PSVT (paroxysmal supraventricular tachycardia) (HCC) 02/24/2011    Past Surgical History:  Procedure Laterality Date  . CARDIAC CATHETERIZATION  2008   mild nonobstructive CAD (40% proximal LAD stenosis). patent mid RCA stent  . CARDIAC CATHETERIZATION N/A 09/14/2015   Procedure: Left Heart Cath and Coronary Angiography;  Surgeon: Marykay Lexavid W Harding, MD;  [facility];  Service: Cardiovascular;  Laterality: N/A;  . CARDIAC CATHETERIZATION N/A 09/14/2015   Procedure: Coronary Stent Intervention;  Surgeon: Marykay Lexavid W Harding, MD;  [facility];  Service: Cardiovascular;  Laterality: N/A;  . CORONARY STENT PLACEMENT     mid RCA: 3.5 X 16 mm Taxus DES  . LEG SURGERY    . SUPRAVENTRICULAR TACHYCARDIA ABLATION N/A 07/02/2012   Procedure: SUPRAVENTRICULAR TACHYCARDIA ABLATION;  Surgeon: Marinus MawGregg W Taylor, MD;  [facility];  Service: Cardiovascular;  Laterality: N/A;       Family History  Problem Relation Age of Onset  . Epilepsy Other   . AAA (abdominal aortic aneurysm) Other   .  CAD Father        PCI  . Other Father        Carotid Artery Disease  . Other Son        bicuspid aortic valve    Social History   Tobacco Use  . Smoking status: Former Smoker    Packs/day: 0.75    Years: 15.00    Pack years: 11.25    Types: Cigarettes    Start date: 10/11/1983    Quit date: 02/24/1998    Years since quitting: 20.9  . Smokeless tobacco: Former Neurosurgeon    Types: Snuff    Quit date: 10/25/2013  . Tobacco comment: dip  occasional on weekend  Substance Use Topics  . Alcohol use: No    Alcohol/week: 0.0 standard drinks  . Drug use: No    Home Medications Prior to Admission medications   Medication Sig Start Date End Date Taking? Authorizing Provider  aspirin EC 81 MG tablet Take 81 mg by mouth daily.     Yes [provider]  atorvastatin (LIPITOR) 40 MG tablet Take 1 tablet (40 mg total) by mouth every evening. 05/23/15  Yes Laqueta Linden, MD  B Complex-C (B-COMPLEX WITH VITAMIN C) tablet Take 1 tablet by mouth daily.   Yes [provider]  finasteride (PROPECIA) 1 MG tablet Take 1 mg by mouth daily. 01/14/19  Yes [provider]  fluticasone (FLONASE) 50 MCG/ACT nasal spray Place 2 sprays into both nostrils daily as needed for allergies or rhinitis.   Yes [provider]  Ginger, Zingiber officinalis, (GINGER EXTRACT PO) Take 1 capsule by mouth 2 (two) times daily.   Yes [provider]  metoprolol succinate (TOPROL-XL) 25 MG 24 hr tablet Take 1 tablet (25 mg total) by mouth 2 (two) times daily. 05/23/15  Yes Laqueta Linden, MD  Omega-3 Fatty Acids (SALMON OIL-1000) 200 MG CAPS Take 1 capsule by mouth daily.   Yes [provider]  VITAMIN A PO Take 1 capsule by mouth daily.   Yes [provider]  VITAMIN D, ERGOCALCIFEROL, PO Take 1 capsule by mouth daily.   Yes [provider]  VITAMIN E PO Take 1 capsule by mouth daily.   Yes [provider]  isosorbide mononitrate (IMDUR) 30 MG 24 hr tablet Take one half a tablet at bedtime 02/08/19   Bethann Berkshire, MD  nitroGLYCERIN (NITROSTAT) 0.4 MG SL tablet Place 1 tablet (0.4 mg total) under the tongue every 5 (five) minutes as needed for chest pain. 09/16/15   Allayne Butcher, PA-C    Allergies    Patient has no known allergies.  Review of Systems   Review of Systems  Constitutional: Positive for diaphoresis. Negative for fever.  Respiratory: Positive for  shortness of breath. Negative for cough.   Cardiovascular: Positive for chest pain. Negative for syncope.  Gastrointestinal: Positive for nausea. Negative for abdominal pain.  Genitourinary: Negative for hematuria.  All other systems reviewed and are negative.   Physical Exam Updated Vital Signs BP 135/80   Pulse 71   Temp 98 F (36.7 C) (Oral)   Resp 18   Ht 1.829 m (6')   Wt 95.3 kg   SpO2 97%   BMI 28.48 kg/m   Physical Exam CONSTITUTIONAL: Well developed/well nourished HEAD: Normocephalic/atraumatic EYES: EOMI/PERRL ENMT: Mucous membranes moist NECK: supple no meningeal signs SPINE/BACK:entire spine nontender CV: S1/S2 noted, no murmurs/rubs/gallops noted LUNGS: Lungs are clear to auscultation bilaterally, no apparent distress ABDOMEN: soft,  nontender, no rebound or guarding, bowel sounds noted throughout abdomen GU:no cva tenderness NEURO: Pt is awake/alert/appropriate, moves all extremitiesx4.  No facial droop.   EXTREMITIES: pulses normal/equal, full ROM SKIN: warm, color normal PSYCH: no abnormalities of mood noted, alert and oriented to situation  ED Results / Procedures / Treatments   Labs (all labs ordered are listed, but only abnormal results are displayed) Labs Reviewed  BASIC METABOLIC PANEL - Abnormal; Notable for the following components:      Result Value   Glucose, Bld 142 (*)    Calcium 8.8 (*)    All other components within normal limits  SARS CORONAVIRUS 2 (TAT 6-24 HRS)  CBC  HEPARIN LEVEL (UNFRACTIONATED)  TROPONIN I (HIGH SENSITIVITY)  TROPONIN I (HIGH SENSITIVITY)    EKG EKG Interpretation  Date/Time:  Thursday February 17 2019 00:33:31 EST Ventricular Rate:  76 PR Interval:    QRS Duration: 96 QT Interval:  382 QTC Calculation: 430 R Axis:   79 Text Interpretation: Sinus rhythm Confirmed by Zadie Rhine ([id number]) on 02/17/2019 12:51:46 AM   Radiology DG Chest 2 View  Result Date: 02/16/2019 CLINICAL DATA:  58 year old  male with chest pain. EXAM: CHEST - 2 VIEW COMPARISON:  Chest radiograph dated 02/08/2019. FINDINGS: The lungs are clear. There is no pleural effusion or pneumothorax. The cardiac silhouette is within normal limits. No acute osseous pathology. IMPRESSION: No active cardiopulmonary disease. Electronically Signed   By: Elgie Collard M.D.   On: 02/16/2019 22:53    Procedures .Critical Care Performed by: Zadie Rhine, MD Authorized by: Zadie Rhine, MD   Critical care provider statement:    Critical care time (minutes):  40   Critical care start time:  02/17/2019 1:20 AM   Critical care end time:  02/17/2019 2:00 AM   Critical care time was exclusive of:  Separately billable procedures and treating other patients   Critical care was necessary to treat or prevent imminent or life-threatening deterioration of the following conditions:  Cardiac failure   Critical care was time spent personally by me on the following activities:  Discussions with consultants, evaluation of patient's response to treatment, examination of patient, re-evaluation of patient's condition, pulse oximetry, ordering and review of radiographic studies, ordering and review of laboratory studies, review of old charts, development of treatment plan with patient or surrogate and ordering and performing treatments and interventions     Medications Ordered in ED Medications  sodium chloride flush (NS) 0.9 % injection 3 mL (3 mLs Intravenous Not Given 02/17/19 0107)  nitroGLYCERIN (NITROSTAT) SL tablet 0.4 mg (0.4 mg Sublingual Given 02/17/19 0129)  heparin ADULT infusion 100 units/mL (25000 units/223mL sodium chloride 0.45%) (1,350 Units/hr Intravenous New Bag/Given 02/17/19 0133)  aspirin chewable tablet 324 mg (324 mg Oral Given 02/17/19 0129)  heparin bolus via infusion 4,000 Units (4,000 Units Intravenous Bolus from Bag 02/17/19 0133)    ED Course  I have reviewed the triage vital signs and the nursing  notes.  Pertinent labs & imaging results that were available during my care of the patient were reviewed by me and considered in my medical decision making (see chart for details).    MDM Rules/Calculators/A&P HEAR Score: 5                    1:23 AM Patient with known history of CAD, last cardiac cath in 2017.  Presents for increasing chest pain and shortness of breath.  Concern for ACS.  Seen earlier in this month  for chest pain but has been unable to start his Imdur at the cardiology request Consult cardiology 2:05 AM Discussed with cardiology Dr. Marletta Lor Since patient has history of CAD with increasing frequency of chest pain that is likely angina, patient will benefit from transfer to cardiac center.  Patient has been started on nitroglycerin, aspirin and heparin.  Patient reports his chest pain is resolving.  Patient may need to undergo cardiac angiography  Patient reports that the [facility] hospital was planning for him to have a stress test.  I informed him that the admitting team may be discussing with his physicians at the [facility] later in the day  Patient otherwise stabilized in the ER.  Low suspicion for PE/dissection at this time Final Clinical Impression(s) / ED Diagnoses Final diagnoses:  Unstable angina [facility])    Rx / DC Orders ED Discharge Orders    None       Ripley Fraise, MD 02/17/19 0206

## 2019-02-17 NOTE — Care Management (Signed)
[facility] number [id number].  Magdalen Spatz RN

## 2019-02-17 NOTE — Progress Notes (Signed)
  Echocardiogram 2D Echocardiogram has been performed.  Kyle [name] 02/17/2019, 10:50 AM

## 2019-02-21 NOTE — Telephone Encounter (Signed)
Called patient to let him know if he cannot get in with the [place] to give our office a call and we would see him. Patient stated he was doing fine except for Nitroglycerin was giving him a headache and he does not think he can continue to take it. Patient stated he was going to call the [place] this morning. Patient stated he would give our office a call if [facility] could not [facility] right away.

## 2021-06-18 ENCOUNTER — Ambulatory Visit (INDEPENDENT_AMBULATORY_CARE_PROVIDER_SITE_OTHER): Payer: Non-veteran care

## 2021-06-18 ENCOUNTER — Ambulatory Visit (INDEPENDENT_AMBULATORY_CARE_PROVIDER_SITE_OTHER): Payer: No Typology Code available for payment source | Admitting: Surgical

## 2021-06-18 DIAGNOSIS — M25551 Pain in right hip: Secondary | ICD-10-CM | POA: Diagnosis not present

## 2021-06-18 DIAGNOSIS — M25552 Pain in left hip: Secondary | ICD-10-CM

## 2021-06-19 ENCOUNTER — Encounter: Payer: Self-pay | Admitting: Surgical

## 2021-06-19 MED ORDER — METHYLPREDNISOLONE ACETATE 40 MG/ML IJ SUSP
40.0000 mg | INTRAMUSCULAR | Status: AC | PRN
  Administered 2021-06-18: 40 mg via INTRA_ARTICULAR

## 2021-06-19 MED ORDER — BUPIVACAINE HCL 0.25 % IJ SOLN
4.0000 mL | INTRAMUSCULAR | Status: AC | PRN
  Administered 2021-06-18: 4 mL via INTRA_ARTICULAR

## 2021-06-19 MED ORDER — LIDOCAINE HCL 1 % IJ SOLN
5.0000 mL | INTRAMUSCULAR | Status: AC | PRN
  Administered 2021-06-18: 5 mL

## 2021-06-19 NOTE — Addendum Note (Signed)
Addended byLaurann Montana on: 06/19/2021 08:16 AM ? ? Modules accepted: Orders ? ?

## 2021-06-19 NOTE — Progress Notes (Signed)
? ?Office Visit Note ?  ?Patient: Kyle [name]           ?Date of Birth: 1960/06/20           ?MRN: [id number] ?Visit Date: 06/18/2021 ?Requested by: [facility], [place] ?[place] [id number] ?PCP: [facility], [facility] ? ?Subjective: ?Chief Complaint  ?Patient presents with  ? Left Hip - Pain  ? Right Hip - Pain  ? ? ?HPI: Kyle [name] is a 61 y.o. male who presents to the office complaining of bilateral hip pain.  Patient states pain has been ongoing for 1 year.  He localizes pain to the lateral aspect of both hips and states that he cannot really lay on either side without pain.  No history of prior injury or hip surgery.  Denies any radicular pain, weakness, pain with ambulation.  Really just has pain with sleeping on either side.  No groin pain.  He has had prior trochanteric injections about 5 to 6 months ago that provided 4 to 5 months of relief where he felt "80% better".  He has not tried any physical therapy.  No history of diabetes..   ?             ?ROS: All systems reviewed are negative as they relate to the chief complaint within the history of present illness.  Patient denies fevers or chills. ? ?Assessment & Plan: ?Visit Diagnoses:  ?1. Greater trochanteric pain syndrome of both lower extremities   ?2. Bilateral hip pain   ? ? ?Plan: Patient is a 61 year old male who presents for evaluation of bilateral hip pain.  He has lateral hip pain that has been improved with bilateral trochanteric injections that he received at the [place].  No groin pain by his history and no pain with hip range of motion on exam today.  Bilateral hip radiographs today demonstrate no significant hip arthritis.  He would like to repeat these injections today.  Also think he would be beneficial for him to do some physical therapy exercises to focus on strengthening the gluteal musculature as well as doing some iliotibial band stretching to prevent this from returning after the injections wear off.   Injections were administered and patient tolerated procedure well.  Plan for him to follow-up with the office in 6-8 weeks for clinical recheck ? ?Follow-Up Instructions: No follow-ups on file.  ? ?Orders:  ?Orders Placed This Encounter  ?Procedures  ? XR HIPS BILAT W OR W/O PELVIS MIN 5 VIEWS  ? ?No orders of the defined types were placed in this encounter. ? ? ? ? Procedures: ?Large Joint Inj: bilateral greater trochanter on 06/18/2021 7:59 AM ?Indications: pain and diagnostic evaluation ?Details: 18 G 1.5 in needle, lateral approach ? ?Arthrogram: No ? ?Medications (Right): 5 mL lidocaine 1 %; 4 mL bupivacaine 0.25 %; 40 mg methylPREDNISolone acetate 40 MG/ML ?Medications (Left): 5 mL lidocaine 1 %; 4 mL bupivacaine 0.25 %; 40 mg methylPREDNISolone acetate 40 MG/ML ?Outcome: tolerated well, no immediate complications ?Procedure, treatment alternatives, risks and benefits explained, specific risks discussed. Consent was given by the patient. Immediately prior to procedure a time out was called to verify the correct patient, procedure, equipment, support staff and site/side marked as required. Patient was prepped and draped in the usual sterile fashion.  ? ? ? ? ?Clinical Data: ?No additional findings. ? ?Objective: ?Vital Signs: There were no vitals taken for this visit. ? ?Physical Exam:  ?Constitutional: Patient appears well-developed ?  HEENT:  ?Head: Normocephalic ?Eyes:EOM are normal ?Neck: Normal range of motion ?Cardiovascular: Normal rate ?Pulmonary/chest: Effort normal ?Neurologic: Patient is alert ?Skin: Skin is warm ?Psychiatric: Patient has normal mood and affect ? ?Ortho Exam: Ortho exam demonstrates bilateral hips without pain with hip range of motion.  Negative Stinchfield sign bilaterally.  Negative straight leg raise bilaterally.  Excellent strength of hip internal rotation actively and hip AB duction actively.  Abduction while patient is in a lateral decubitus position of either hip reproduces his  pain.  He has moderate tenderness over the greater trochanter bilaterally.  No cellulitis or skin changes noted.  No Trendelenburg gait. ? ?Specialty Comments:  ?No specialty comments available. ? ?Imaging: ?No results found. ? ? ?PMFS History: ?Patient Active Problem List  ? Diagnosis Date Noted  ? Hyperlipidemia 02/17/2019  ? Coronary artery disease involving native coronary artery of native heart with unstable angina pectoris ([place]) 09/14/2015  ? Unstable angina ([place]) 09/14/2015  ? Chest pain 09/13/2015  ? GERD (gastroesophageal reflux disease) 07/11/2014  ? Coronary artery disease   ? PSVT (paroxysmal supraventricular tachycardia) ([place]) 02/24/2011  ? ?Past Medical History:  ?Diagnosis Date  ? Chronic back pain   ? Coronary artery disease   ? remote PCI to RCA, more recently s/p LAD PCI in 2017  ? Hyperlipidemia   ? PSVT (paroxysmal supraventricular tachycardia) ([place])   ?  ?Family History  ?Problem Relation Age of Onset  ? Epilepsy Other   ? AAA (abdominal aortic aneurysm) Other   ? CAD Father   ?     PCI  ? Other Father   ?     Carotid Artery Disease  ? Other Son   ?     bicuspid aortic valve  ?  ?Past Surgical History:  ?Procedure Laterality Date  ? CARDIAC CATHETERIZATION  2008  ? mild nonobstructive CAD (40% proximal LAD stenosis). patent mid RCA stent  ? CARDIAC CATHETERIZATION N/A 09/14/2015  ? Procedure: Left Heart Cath and Coronary Angiography;  Surgeon: Leonie Man, MD;  Location: [facility] CV LAB;  Service: Cardiovascular;  Laterality: N/A;  ? CARDIAC CATHETERIZATION N/A 09/14/2015  ? Procedure: Coronary Stent Intervention;  Surgeon: Leonie Man, MD;  Location: [place] CV LAB;  Service: Cardiovascular;  Laterality: N/A;  ? CORONARY STENT PLACEMENT    ? mid RCA: 3.5 X 16 mm Taxus DES  ? INTRAVASCULAR PRESSURE WIRE/FFR STUDY N/A 02/17/2019  ? Procedure: INTRAVASCULAR PRESSURE WIRE/FFR STUDY;  Surgeon: Belva Crome, MD;  Location: [place] CV LAB;  Service: Cardiovascular;  Laterality: N/A;   ? LEFT HEART CATH AND CORONARY ANGIOGRAPHY N/A 02/17/2019  ? Procedure: LEFT HEART CATH AND CORONARY ANGIOGRAPHY;  Surgeon: Belva Crome, MD;  Location: [place] CV LAB;  Service: Cardiovascular;  Laterality: N/A;  ? LEG SURGERY    ? SUPRAVENTRICULAR TACHYCARDIA ABLATION N/A 07/02/2012  ? Procedure: SUPRAVENTRICULAR TACHYCARDIA ABLATION;  Surgeon: Evans Lance, MD;  Location: [facility] CATH LAB;  Service: Cardiovascular;  Laterality: N/A;  ? ?Social History  ? ?Occupational History  ?  Employer: [employer]  ?Tobacco Use  ? Smoking status: Former  ?  Packs/day: 0.75  ?  Years: 15.00  ?  Pack years: 11.25  ?  Types: Cigarettes  ?  Start date: 10/11/1983  ?  Quit date: 02/24/1998  ?  Years since quitting: 23.3  ? Smokeless tobacco: Former  ?  Types: Snuff  ?  Quit date: 10/25/2013  ? Tobacco comments:  ?  dip occasional on weekend  ?Substance and Sexual Activity  ? Alcohol use: No  ?  Alcohol/week: 0.0 standard drinks  ? Drug use: No  ? Sexual activity: Not on file  ? ? ? ? ?  ?

## 2021-07-01 ENCOUNTER — Encounter: Payer: Self-pay | Admitting: Physical Therapy

## 2021-07-01 ENCOUNTER — Ambulatory Visit (INDEPENDENT_AMBULATORY_CARE_PROVIDER_SITE_OTHER): Payer: No Typology Code available for payment source | Admitting: Physical Therapy

## 2021-07-01 DIAGNOSIS — R29898 Other symptoms and signs involving the musculoskeletal system: Secondary | ICD-10-CM | POA: Diagnosis not present

## 2021-07-01 DIAGNOSIS — M25552 Pain in left hip: Secondary | ICD-10-CM

## 2021-07-01 DIAGNOSIS — M25551 Pain in right hip: Secondary | ICD-10-CM | POA: Diagnosis not present

## 2021-07-01 DIAGNOSIS — M6281 Muscle weakness (generalized): Secondary | ICD-10-CM | POA: Diagnosis not present

## 2021-07-01 NOTE — Therapy (Addendum)
OUTPATIENT PHYSICAL THERAPY LOWER EXTREMITY EVALUATION DISCHARGE SUMMARY   Patient Name: Kyle [name] MRN: [id number] DOB:March 31, 1960, 61 y.o., male Today's Date: 07/01/2021   PT End of Session - 07/01/21 0924     Visit Number 1    Number of Visits 3    Date for PT Re-Evaluation 08/12/21    PT Start Time 5859    PT Stop Time 0919    PT Time Calculation (min) 32 min    Activity Tolerance Patient tolerated treatment well    Behavior During Therapy [facility] for tasks assessed/performed             Past Medical History:  Diagnosis Date   Chronic back pain    Coronary artery disease    remote PCI to RCA, more recently s/p LAD PCI in 2017   Hyperlipidemia    PSVT (paroxysmal supraventricular tachycardia) [facility])    Past Surgical History:  Procedure Laterality Date   CARDIAC CATHETERIZATION  2008   mild nonobstructive CAD (40% proximal LAD stenosis). patent mid RCA stent   CARDIAC CATHETERIZATION N/A 09/14/2015   Procedure: Left Heart Cath and Coronary Angiography;  Surgeon: Leonie Man, MD;  Location: [place] [facility];  Service: Cardiovascular;  Laterality: N/A;   CARDIAC CATHETERIZATION N/A 09/14/2015   Procedure: Coronary Stent Intervention;  Surgeon: Leonie Man, MD;  Location: [place] [facility] LAB;  Service: Cardiovascular;  Laterality: N/A;   CORONARY STENT PLACEMENT     mid RCA: 3.5 X 16 mm Taxus DES   INTRAVASCULAR PRESSURE WIRE/FFR STUDY N/A 02/17/2019   Procedure: INTRAVASCULAR PRESSURE WIRE/FFR STUDY;  Surgeon: Belva Crome, MD;  Location: [place] CV LAB;  Service: Cardiovascular;  Laterality: N/A;   LEFT HEART CATH AND CORONARY ANGIOGRAPHY N/A 02/17/2019   Procedure: LEFT HEART CATH AND CORONARY ANGIOGRAPHY;  Surgeon: Belva Crome, MD;  Location: [place] [facility] LAB;  Service: Cardiovascular;  Laterality: N/A;   LEG SURGERY     SUPRAVENTRICULAR TACHYCARDIA ABLATION N/A 07/02/2012   Procedure: SUPRAVENTRICULAR TACHYCARDIA ABLATION;  Surgeon: Evans Lance,  MD;  Location: [facility] CATH LAB;  Service: Cardiovascular;  Laterality: N/A;   Patient Active Problem List   Diagnosis Date Noted   Hyperlipidemia 02/17/2019   Coronary artery disease involving native coronary artery of native heart with unstable angina pectoris ([place]) 09/14/2015   Unstable angina ([place]) 09/14/2015   Chest pain 09/13/2015   GERD (gastroesophageal reflux disease) 07/11/2014   Coronary artery disease    PSVT (paroxysmal supraventricular tachycardia) ([place]) 02/24/2011    PCP: [facility] [place]   REFERRING PROVIDER: Donella Stade, PA-C   REFERRING DIAG: [id number] (ICD-10-CM) - Bilateral hip pain M25.551,M25.552 (ICD-10-CM) - Greater trochanteric pain syndrome of both lower extremities   THERAPY DIAG:  Pain in left hip - Plan: PT plan of care cert/re-cert  Pain in right hip - Plan: PT plan of care cert/re-cert  Muscle weakness (generalized) - Plan: PT plan of care cert/re-cert  Other symptoms and signs involving the musculoskeletal system - Plan: PT plan of care cert/re-cert  ONSET DATE: > 1 year  SUBJECTIVE:   SUBJECTIVE STATEMENT: Pt is a 61 y/o male who presents to OPPT for ~ 1 year hx of bil hip pain.  He reports pain is worse with lying on both sides.  He had injections to both hips with improvement for about 5-6 months.  He then had repeat injections about 2 weeks ago, and has had improvement since then.  PERTINENT HISTORY: HLD, CAD  s/p coronary stent x 3, PSVT  PAIN:  Are you having pain? Yes: NPRS scale: 1-2 currently, up to 7-8/10 Pain location: bil hips Pain description: aching Aggravating factors: lying on either side Relieving factors: avoiding side sleeping, injections  PRECAUTIONS: None  WEIGHT BEARING RESTRICTIONS No  FALLS:  Has patient fallen in last 6 months? No  LIVING ENVIRONMENT: Lives with: lives with their family (wife and 58 y/o son) Lives in: House/apartment Stairs: No Has following equipment at home:  None  OCCUPATION: buys/sells houses (mostly retired)  PLOF: Independent and Leisure: fishing, golfing, hiking; home gym and works out 4-5 days/wk (elliptical, adjustable dumbbells, modified rowing machine), will also be joining [employer] improve pain   OBJECTIVE:   DIAGNOSTIC FINDINGS: Bilateral hip radiographs demonstrate no significant hip arthritis   PATIENT SURVEYS:  07/01/21 FOTO 85 (predicted 81)  COGNITION:  Overall cognitive status: Within functional limits for tasks assessed     SENSATION: [facility]  MUSCLE LENGTH: 07/01/21: piriformis tightness bil (Lt >Rt)  POSTURE:  No significant abnormalities noted  PALPATION: Tenderness bil greater trochanter; trigger points in bil lateral quads and TFL  LE ROM: 07/01/21: bil LE AROM WNL  LE MMT:  MMT Right 07/01/2021 Left 07/01/2021  Hip flexion 5/5 5/5  Hip extension 3/5 4/5  Hip abduction 4/5 4/5  Hip adduction    Hip internal rotation 5/5 4/5  Hip external rotation 5/5 4/5   (Blank rows = not tested)   GAIT: Comments: Independent; mild trendelenburg bil, otherwise no significant deviations.    TODAY'S TREATMENT: 07/01/21:  See HEP - performed 1-2 reps PRN of exercises for understanding; pt verbalized understanding   PATIENT EDUCATION:  Education details: HEP Person educated: Patient Education method: Explanation, Demonstration, and Handouts Education comprehension: verbalized understanding, returned demonstration, and needs further education   HOME EXERCISE PROGRAM: Access Code: [email or web address] URL: [email or web address] Date: 07/01/2021 Prepared by: Faustino Congress  Exercises - Supine Bridge  - 2 x daily - 7 x weekly - 1 sets - 10 reps - 5 sec hold - Standing Hip Hiking  - 2 x daily - 7 x weekly - 1 sets - 10 reps - 10 sec hold - Sidelying Hip Abduction  - 2 x daily - 7 x weekly - 1 sets - 10 reps - Supine Figure 4 Piriformis Stretch  - 2 x daily - 7 x weekly - 1 sets - 3 reps -  30 sec hold - Supine Piriformis Stretch with Leg Straight  - 2 x daily - 7 x weekly - 1 sets - 3 reps - 30 sec hold - ITB Stretch at Wall  - 2 x daily - 7 x weekly - 1 sets - 3 reps - 30 sec hold  ASSESSMENT:  CLINICAL IMPRESSION: Patient is a 61 y.o. male who was seen today for physical therapy evaluation and treatment for bil hip pain. He demonstrates decreased strength and flexibility as well as some trigger points affecting functional mobility.  Pt will benefit from PT to address deficits listed.    OBJECTIVE IMPAIRMENTS decreased strength, increased fascial restrictions, increased muscle spasms, impaired flexibility, and pain.   ACTIVITY LIMITATIONS cleaning, community activity, driving, occupation, and sleeping .   PERSONAL FACTORS 3+ comorbidities: HLD, CAD s/p coronary stent x 3, PSVT  are also affecting patient's functional outcome.    REHAB POTENTIAL: Excellent  CLINICAL DECISION MAKING: Stable/uncomplicated  EVALUATION COMPLEXITY: Low   GOALS: Goals reviewed with patient? Yes  SHORT TERM GOALS: Target date: 07/22/2021  Independent with initial HEP Goal status: INITIAL   LONG TERM GOALS: Target date: 08/12/2021  Independent with final HEP Goal status: INITIAL  2.  FOTO score maintained for no loss of function Goal status: INITIAL  3.  Bil hip strength improved to 4+/5 for improved function and mobility Goal status: INIITAL  4.  Report pain < 2/10 with side sleeping for improved function Goal status: INITIAL  PLAN: PT FREQUENCY: 2x/month  PT DURATION: 6 weeks  PLANNED INTERVENTIONS: Therapeutic exercises, Therapeutic activity, Neuromuscular re-education, Patient/Family education, Joint mobilization, Aquatic Therapy, Dry Needling, Electrical stimulation, Cryotherapy, Moist heat, Taping, Ionotophoresis 62m/ml Dexamethasone, and Manual therapy  PLAN FOR NEXT SESSION: review HEP, give gym program, manual/modalities/DN PRN    SLaureen Abrahams PT,  DPT 07/01/21 9:27 AM     PHYSICAL THERAPY DISCHARGE SUMMARY  Visits from Start of Care: 1  Current functional level related to goals / functional outcomes: See above; had set up to transfer to clinic closer to home but pt cx these appts   Remaining deficits: unknown   Education / Equipment: HEP   Patient agrees to discharge. Patient goals were not met. Patient is being discharged due to not returning since the last visit.  SLaureen Abrahams PT, DPT 08/12/21 10:46 AM  [facility] [address] [address]Phone: [phone]  Fax:  [phone]

## 2021-07-24 ENCOUNTER — Ambulatory Visit (HOSPITAL_COMMUNITY): Payer: Non-veteran care

## 2021-12-11 NOTE — Progress Notes (Signed)
Initial neurology clinic note  Kyle [name] MRN: [id number] DOB: [birth year]  Referring provider: Wilhemina Bonito., *  Primary care provider: [facility]  Reason for consult:  migraines  Subjective:  This is Mr. Kyle [name], a 61 y.o. left-handed male with a medical history of HTN, HLD, CAD, SVT, pre-diabetes, bursitis of bilateral hips, allergic rhinitis, BPH, and former smoker who presents to neurology clinic with headaches. The patient is accompanied by wife.  Onset:  Patient first start having severe headaches about 6-7 years ago. He had more mild headaches in the past, but more related to neck pain, sinuses, and tension. Location:  Whole head usually Quality:  Pressure, stabbing  Intensity:  10/10.  It takes 5-10 minutes to reach maximum intensity. He denies new headache, thunderclap headache or severe headache that wakes from sleep. Aura:  No Prodrome:  No Postdrome:  Typically has residual pain on the left forehead area that fades out over a day Associated symptoms:  Nausea, photophobia, phonophobia.  He denies associated unilateral numbness or weakness. Duration:  6-7 hours Frequency:  1-2 per month Frequency of abortive medication: 3-4 per months Triggers:  sensitive to odors/perfumes Relieving factors:  laying in cold dark quiet room Activity:  does not want to be active  Past NSAIDS/analgesics:  Tylenol (didn't help) Past abortive triptans:  Imitrex (no help) - never prescribed to patient Past abortive ergotamine:  took a Nurtec from sister in law that immediately aborted HA Past muscle relaxants:  None Past anti-emetic:  None Past antihypertensive medications:  None Past antidepressant medications:  None Past anticonvulsant medications:  Topamax (took as PRN) - never prescribed to patient Past anti-CGRP:  None Past vitamins/Herbal/Supplements:  None Past antihistamines/decongestants:  None Other past therapies:  None  Current  NSAIDS/analgesics:  Tylenol Current triptans:  None Current ergotamine:  None Current anti-emetic:  None Current muscle relaxants:  None Current Antihypertensive medications:  None Current Antidepressant medications:  None Current Anticonvulsant medications:  None Current anti-CGRP:  None Current Vitamins/Herbal/Supplements:  None Current Antihistamines/Decongestants:  None Other therapy:  None Other medications:  None  Caffeine:  < 1 cup per day Alcohol:  None Smoker:  None Diet:  Very little red meat, more chicken and fish. Does not eat deli meats or salty foods. Likes chocolates Exercise:  stays active, 5 days a week at [employer] Depression:  No; Anxiety:  No Other pain:  occasional neck pain, low back pain, hip pain (bursitis) Sleep hygiene:  ~6 hours per night Family history of headache:  son, sister, father  MEDICATIONS:  Outpatient Encounter Medications as of 12/13/2021  Medication Sig   aspirin EC 81 MG tablet Take 81 mg by mouth daily.     atorvastatin (LIPITOR) 40 MG tablet Take 1 tablet (40 mg total) by mouth every evening.   fluticasone (FLONASE) 50 MCG/ACT nasal spray Place 2 sprays into both nostrils daily as needed for allergies or rhinitis.   metoprolol succinate (TOPROL-XL) 25 MG 24 hr tablet Take 1 tablet (25 mg total) by mouth 2 (two) times daily.   nitroGLYCERIN (NITROSTAT) 0.4 MG SL tablet Place 1 tablet (0.4 mg total) under the tongue every 5 (five) minutes as needed for chest pain.   Omega-3 Fatty Acids (SALMON OIL-1000) 200 MG CAPS Take 1 capsule by mouth daily.   B Complex-C (B-COMPLEX WITH VITAMIN C) tablet Take 1 tablet by mouth daily. (Patient not taking: Reported on 12/13/2021)   finasteride (PROPECIA) 1 MG tablet Take 1  mg by mouth daily. (Patient not taking: Reported on 12/13/2021)   Ginger, Zingiber officinalis, (GINGER EXTRACT PO) Take 1 capsule by mouth 2 (two) times daily. (Patient not taking: Reported on 12/13/2021)   isosorbide mononitrate  (IMDUR) 30 MG 24 hr tablet Take 1 tablet (30 mg total) by mouth daily. (Patient not taking: Reported on 12/13/2021)   VITAMIN A PO Take 1 capsule by mouth daily. (Patient not taking: Reported on 12/13/2021)   VITAMIN D, ERGOCALCIFEROL, PO Take 1 capsule by mouth daily. (Patient not taking: Reported on 12/13/2021)   VITAMIN E PO Take 1 capsule by mouth daily. (Patient not taking: Reported on 12/13/2021)   No facility-administered encounter medications on file as of 12/13/2021.    PAST MEDICAL HISTORY: Past Medical History:  Diagnosis Date   Chronic back pain    Coronary artery disease    remote PCI to RCA, more recently s/p LAD PCI in 2017   Hyperlipidemia    PSVT (paroxysmal supraventricular tachycardia)     PAST SURGICAL HISTORY: Past Surgical History:  Procedure Laterality Date   CARDIAC CATHETERIZATION  2008   mild nonobstructive CAD (40% proximal LAD stenosis). patent mid RCA stent   CARDIAC CATHETERIZATION N/A 09/14/2015   Procedure: Left Heart Cath and Coronary Angiography;  Surgeon: Marykay Lex, MD;  [facility]: [facility] INVASIVE [facility] LAB;  Service: Cardiovascular;  Laterality: N/A;   CARDIAC CATHETERIZATION N/A 09/14/2015   Procedure: Coronary Stent Intervention;  Surgeon: Marykay Lex, MD;  Location: [facility] INVASIVE CV LAB;  Service: Cardiovascular;  Laterality: N/A;   CORONARY STENT PLACEMENT     mid RCA: 3.5 X 16 mm Taxus DES   INTRAVASCULAR PRESSURE WIRE/FFR STUDY N/A 02/17/2019   Procedure: INTRAVASCULAR PRESSURE WIRE/FFR STUDY;  Surgeon: Lyn Records, MD;  Location: [facility] INVASIVE CV LAB;  Service: Cardiovascular;  Laterality: N/A;   LEFT HEART CATH AND CORONARY ANGIOGRAPHY N/A 02/17/2019   Procedure: LEFT HEART CATH AND CORONARY ANGIOGRAPHY;  Surgeon: Lyn Records, MD;  Location: [facility] INVASIVE CV LAB;  Service: Cardiovascular;  Laterality: N/A;   LEG SURGERY     SUPRAVENTRICULAR TACHYCARDIA ABLATION N/A 07/02/2012   Procedure: SUPRAVENTRICULAR TACHYCARDIA ABLATION;  Surgeon:  Marinus Maw, MD;  Location: [facility] CATH LAB;  Service: Cardiovascular;  Laterality: N/A;    ALLERGIES: No Known Allergies  FAMILY HISTORY: Family History  Problem Relation Age of Onset   CAD Father        PCI   Other Father        Carotid Artery Disease   Other Son        bicuspid aortic valve   Epilepsy Other    AAA (abdominal aortic aneurysm) Other     SOCIAL HISTORY: Social History   Tobacco Use   Smoking status: Former    Packs/day: 0.75    Years: 15.00    Total pack years: 11.25    Types: Cigarettes    Start date: 10/11/1983    Quit date: 02/24/1998    Years since quitting: 23.8   Smokeless tobacco: Former    Types: Snuff    Quit date: 10/25/2013   Tobacco comments:    dip occasional on weekend  Vaping Use   Vaping Use: Never used  Substance Use Topics   Alcohol use: No    Alcohol/week: 0.0 standard drinks of alcohol   Drug use: No   Social History   Social History Narrative   Left Hand    Home is two story   Caffeine occas  Objective:  Vital Signs:  BP 129/70   Pulse 78   Ht 6' (1.829 m)   Wt 232 lb (105.2 kg)   SpO2 97%   BMI 31.46 kg/m   General: No acute distress.  Patient appears well-groomed.   Head:  Normocephalic/atraumatic Eyes:  fundi examined but not visualized Neck: supple, no paraspinal tenderness, full range of motion Back: No paraspinal tenderness Heart: regular rate and rhythm Lungs: Clear to auscultation bilaterally. Vascular: No carotid bruits.  Neurological Exam: Mental status: alert and oriented, speech fluent and not dysarthric, language intact.  Cranial nerves: CN I: not tested CN II: pupils equal, round and reactive to light, visual fields intact CN III, IV, VI:  full range of motion, no nystagmus, no ptosis CN V: facial sensation intact. CN VII: upper and lower face symmetric CN VIII: hearing intact CN IX, X: gag intact, uvula midline CN XI: sternocleidomastoid and trapezius muscles intact CN XII: tongue  midline  Bulk & Tone: normal, no fasciculations. Motor:  muscle strength 5/5 throughout Deep Tendon Reflexes:  2+ throughout,  toes downgoing.   Sensation:  Pinprick, temperature and vibratory sensation intact. Finger to nose testing:  Without dysmetria.     Gait:  Normal station and stride.  Romberg negative.   Labs and Imaging review: Internal labs: Lab Results  Component Value Date   HGBA1C 5.5 02/17/2019   No results found for: "VITAMINB12" Lab Results  Component Value Date   TSH 1.906 02/24/2011   No results found for: "ESRSEDRATE", "POCTSEDRATE"  External labs: Normal or unremarkable: CBC, CMP, TSH, Mg HbA1c (10/2021): 5.8  Assessment/Plan:  SANTIAGO STENZEL is a 61 y.o. male who presents for evaluation of headaches. He has a relevant medical history of CAD, HTN, HLD, SVT, pre-diabetes, bursitis of bilateral hips, allergic rhinitis, BPH, and former smoker. His neurological examination is essentially normal. Patient's symptoms are most consistent with migraine, without aura. He has 1-2 headaches per month and has not tried previous medications, so I would normally recommend a Triptan, but given his cardiac history this is contraindicated. Patient has previously tried a relative's Nurtec with complete relief of his headache, so I will attempt to get this approved as it is likely his best and safest option. He does not need a preventative medication at this point, but will continue to monitor this.  PLAN: -Blood work: B12 -Nurtec 75 mg daily PRN -Limit use of pain relievers to no more than 2 days out of week to prevent risk of rebound or medication-overuse headache.  -Patient to call if new symptoms or headaches are not helped by Nurtec  -Return to clinic in 4 months  The impression above as well as the plan as outlined below were extensively discussed with the patient (in the company of wife) who voiced understanding. All questions were answered to their  satisfaction.  When available, results of the above investigations and possible further recommendations will be communicated to the patient via telephone/MyChart. Patient to call office if not contacted after expected testing turnaround time.   Total time spent reviewing records, interview, history/exam, documentation, and coordination of care on day of encounter:  50 min   Thank you for allowing me to participate in patient's care.  If I can answer any additional questions, I would be pleased to do so.  Kai Levins, MD   CC: [facility] [place] [facility] [address] [place] [address]  CC: Referring provider: Pearson Forster., MD [place],  [place]  [id number]  

## 2021-12-13 ENCOUNTER — Encounter: Payer: Self-pay | Admitting: Neurology

## 2021-12-13 ENCOUNTER — Ambulatory Visit (INDEPENDENT_AMBULATORY_CARE_PROVIDER_SITE_OTHER): Payer: No Typology Code available for payment source | Admitting: Neurology

## 2021-12-13 ENCOUNTER — Other Ambulatory Visit (INDEPENDENT_AMBULATORY_CARE_PROVIDER_SITE_OTHER): Payer: No Typology Code available for payment source

## 2021-12-13 VITALS — BP 129/70 | HR 78 | Ht 72.0 in | Wt 232.0 lb

## 2021-12-13 DIAGNOSIS — H53149 Visual discomfort, unspecified: Secondary | ICD-10-CM

## 2021-12-13 DIAGNOSIS — G43009 Migraine without aura, not intractable, without status migrainosus: Secondary | ICD-10-CM

## 2021-12-13 DIAGNOSIS — R110 Nausea: Secondary | ICD-10-CM | POA: Diagnosis not present

## 2021-12-13 DIAGNOSIS — F40298 Other specified phobia: Secondary | ICD-10-CM | POA: Diagnosis not present

## 2021-12-13 LAB — VITAMIN B12: Vitamin B-12: 348 pg/mL (ref 211–911)

## 2021-12-13 MED ORDER — NURTEC 75 MG PO TBDP: 75.0000 mg | ORAL_TABLET | Freq: Every day | ORAL | 5 refills | Status: DC | PRN

## 2021-12-13 NOTE — Patient Instructions (Signed)
We are ordering Nurtec to help with your headaches.  I have to try to get this approved first, but we will try to do this as other options may not be possible for you.  Let me know if you have not heard anything about the medication within a couple of weeks.  I want to check your B12 level today. I will be in touch when I have your result.  I want to see you back in clinic in 4 months. Please let me know if you have any questions or concerns in the meantime.   The physicians and staff at [facility] are committed to providing excellent care. You may receive a survey requesting feedback about your experience at our office. We strive to receive "very good" responses to the survey questions. If you feel that your experience would prevent you from giving the office a "very good " response, please contact our office to try to remedy the situation. We may be reached at [phone]. Thank you for taking the time out of your busy day to complete the survey.  Kai Levins, MD [facility]

## 2022-04-09 NOTE — Progress Notes (Signed)
NEUROLOGY FOLLOW UP OFFICE NOTE  PARMINDER FORTON [id number]  Subjective:  Kyle [name] is a 62 y.o. year old left-handed male with a medical history of HTN, HLD, CAD, SVT, pre-diabetes, bursitis of bilateral hips, allergic rhinitis, BPH, and former smoker who we last saw on 12/13/21.  To briefly review: 12/13/21 initial visit: Onset:  Patient first start having severe headaches about 6-7 years ago. He had more mild headaches in the past, but more related to neck pain, sinuses, and tension. Location:  Whole head usually Quality:  Pressure, stabbing  Intensity:  10/10.  It takes 5-10 minutes to reach maximum intensity. He denies new headache, thunderclap headache or severe headache that wakes from sleep. Aura:  No Prodrome:  No Postdrome:  Typically has residual pain on the left forehead area that fades out over a day Associated symptoms:  Nausea, photophobia, phonophobia.  He denies associated unilateral numbness or weakness. Duration:  6-7 hours Frequency:  1-2 per month Frequency of abortive medication: 3-4 per months Triggers:  sensitive to odors/perfumes Relieving factors:  laying in cold dark quiet room Activity:  does not want to be active   Past NSAIDS/analgesics:  Tylenol (didn't help) Past abortive triptans:  Imitrex (no help) - never prescribed to patient Past abortive ergotamine:  took a Nurtec from sister in law that immediately aborted HA Past muscle relaxants:  None Past anti-emetic:  None Past antihypertensive medications:  None Past antidepressant medications:  None Past anticonvulsant medications:  Topamax (took as PRN) - never prescribed to patient Past anti-CGRP:  None Past vitamins/Herbal/Supplements:  None Past antihistamines/decongestants:  None Other past therapies:  None   Current NSAIDS/analgesics:  Tylenol Current triptans:  None Current ergotamine:  None Current anti-emetic:  None Current muscle relaxants:  None Current Antihypertensive  medications:  None Current Antidepressant medications:  None Current Anticonvulsant medications:  None Current anti-CGRP:  None Current Vitamins/Herbal/Supplements:  None Current Antihistamines/Decongestants:  None Other therapy:  None Other medications:  None   Caffeine:  < 1 cup per day Alcohol:  None Smoker:  None Diet:  Very little red meat, more chicken and fish. Does not eat deli meats or salty foods. Likes chocolates Exercise:  stays active, 5 days a week at [employer] Depression:  No; Anxiety:  No Other pain:  occasional neck pain, low back pain, hip pain (bursitis) Sleep hygiene:  ~6 hours per night Family history of headache:  son, sister, father  Most recent Assessment and Plan (12/13/21): Patient's symptoms are most consistent with migraine, without aura. He has 1-2 headaches per month and has not tried previous medications, so I would normally recommend a Triptan, but given his cardiac history this is contraindicated. Patient has previously tried a relative's Nurtec with complete relief of his headache, so I will attempt to get this approved as it is likely his best and safest option. He does not need a preventative medication at this point, but will continue to monitor this.   PLAN: -Blood work: B12 -Nurtec 75 mg daily PRN -Limit use of pain relievers to no more than 2 days out of week to prevent risk of rebound or medication-overuse headache.  Since their last visit: B12 was 348. Patient has been taking Nurtec 1-2 times per month. He will take it at onset and it will relieve his headache within 1 hour. He is very pleased with his current symptoms.  He has no new complaints. He did have a fall from a ladder in 11/2021. He had xrays  and only bruised his ribs. This has improved.  MEDICATIONS:  Outpatient Encounter Medications as of 04/16/2022  Medication Sig   aspirin EC 81 MG tablet Take 81 mg by mouth daily.     atorvastatin (LIPITOR) 40 MG tablet Take 1 tablet  (40 mg total) by mouth every evening.   fluticasone (FLONASE) 50 MCG/ACT nasal spray Place 2 sprays into both nostrils daily as needed for allergies or rhinitis.   metoprolol succinate (TOPROL-XL) 25 MG 24 hr tablet Take 1 tablet (25 mg total) by mouth 2 (two) times daily. (Patient taking differently: Take 25 mg by mouth daily.)   NIACIN PO Take by mouth.   nitroGLYCERIN (NITROSTAT) 0.4 MG SL tablet Place 1 tablet (0.4 mg total) under the tongue every 5 (five) minutes as needed for chest pain.   Omega-3 Fatty Acids (SALMON OIL-1000) 200 MG CAPS Take 1 capsule by mouth daily.   Rimegepant Sulfate (NURTEC) 75 MG TBDP Take 75 mg by mouth daily as needed.   B Complex-C (B-COMPLEX WITH VITAMIN C) tablet Take 1 tablet by mouth daily. (Patient not taking: Reported on 12/13/2021)   finasteride (PROPECIA) 1 MG tablet Take 1 mg by mouth daily. (Patient not taking: Reported on 12/13/2021)   Ginger, Zingiber officinalis, (GINGER EXTRACT PO) Take 1 capsule by mouth 2 (two) times daily. (Patient not taking: Reported on 12/13/2021)   isosorbide mononitrate (IMDUR) 30 MG 24 hr tablet Take 1 tablet (30 mg total) by mouth daily. (Patient not taking: Reported on 12/13/2021)   VITAMIN A PO Take 1 capsule by mouth daily. (Patient not taking: Reported on 12/13/2021)   VITAMIN D, ERGOCALCIFEROL, PO Take 1 capsule by mouth daily. (Patient not taking: Reported on 12/13/2021)   VITAMIN E PO Take 1 capsule by mouth daily. (Patient not taking: Reported on 12/13/2021)   No facility-administered encounter medications on file as of 04/16/2022.    PAST MEDICAL HISTORY: Past Medical History:  Diagnosis Date   Chronic back pain    Coronary artery disease    remote PCI to RCA, more recently s/p LAD PCI in 2017   Hyperlipidemia    Hypertension    Migraine    Pre-diabetes    PSVT (paroxysmal supraventricular tachycardia)     PAST SURGICAL HISTORY: Past Surgical History:  Procedure Laterality Date   CARDIAC  CATHETERIZATION  2008   mild nonobstructive CAD (40% proximal LAD stenosis). patent mid RCA stent   CARDIAC CATHETERIZATION N/A 09/14/2015   Procedure: Left Heart Cath and Coronary Angiography;  Surgeon: Leonie Man, MD;  Location: [place] [facility];  Service: Cardiovascular;  Laterality: N/A;   CARDIAC CATHETERIZATION N/A 09/14/2015   Procedure: Coronary Stent Intervention;  Surgeon: Leonie Man, MD;  Location: [place] [facility];  Service: Cardiovascular;  Laterality: N/A;   CORONARY STENT PLACEMENT     mid RCA: 3.5 X 16 mm Taxus DES   INTRAVASCULAR PRESSURE WIRE/FFR STUDY N/A 02/17/2019   Procedure: INTRAVASCULAR PRESSURE WIRE/FFR STUDY;  Surgeon: Belva Crome, MD;  Location: [place] [facility];  Service: Cardiovascular;  Laterality: N/A;   LEFT HEART CATH AND CORONARY ANGIOGRAPHY N/A 02/17/2019   Procedure: LEFT HEART CATH AND CORONARY ANGIOGRAPHY;  Surgeon: Belva Crome, MD;  Location: [place] [facility];  Service: Cardiovascular;  Laterality: N/A;   LEG SURGERY     SUPRAVENTRICULAR TACHYCARDIA ABLATION N/A 07/02/2012   Procedure: SUPRAVENTRICULAR TACHYCARDIA ABLATION;  Surgeon: Evans Lance, MD;  Location: [facility] C[facility] LAB;  Service: Cardiovascular;  Laterality: N/A;  ALLERGIES: No Known Allergies  FAMILY HISTORY: Family History  Problem Relation Age of Onset   CAD Father        PCI   Other Father        Carotid Artery Disease   Other Son        bicuspid aortic valve   Epilepsy Other    AAA (abdominal aortic aneurysm) Other     SOCIAL HISTORY: Social History   Tobacco Use   Smoking status: Former    Packs/day: 0.75    Years: 15.00    Total pack years: 11.25    Types: Cigarettes    Start date: 10/11/1983    Quit date: 02/24/1998    Years since quitting: 24.1   Smokeless tobacco: Former    Types: Snuff    Quit date: 10/25/2013   Tobacco comments:    dip occasional on weekend  Vaping Use   Vaping Use: Never used  Substance Use Topics   Alcohol use: No     Alcohol/week: 0.0 standard drinks of alcohol   Drug use: No   Social History   Social History Narrative   Left Hand    Home is two story   Caffeine occa   Retired      Objective:  Vital Signs:  BP 135/78   Pulse 65   Ht 6' (1.829 m)   Wt 239 lb (108.4 kg)   SpO2 98%   BMI 32.41 kg/m   General: No acute distress.  Patient appears well-groomed.   Head:  Normocephalic/atraumatic Neck: supple Lungs:  Non-labored breathing on room air Extremities: No significant edema Neurological Exam: alert and oriented to person, place, and time.  Speech fluent and not dysarthric, language intact.  CN II-XII intact. Bulk and tone normal, muscle strength 5/5 throughout.  Sensation to light touch intact.  Deep tendon reflexes intact and symmetric.  Finger to nose testing intact.  Gait normal.  Labs and Imaging review: New results: B12 (12/13/21): 348  Previously reviewed results: Lab Results  Component Value Date    HGBA1C 5.5 02/17/2019      Recent Labs       Lab Results  Component Value Date    TSH 1.906 02/24/2011     External labs: Normal or unremarkable: CBC, CMP, TSH, Mg HbA1c (10/2021): 5.8  Assessment/Plan:  This is [employer], a 62 y.o. male with episodic migraine without aura. He is currently doing well on Nurtec PRN with 1-2 migraines per month. He also has neck pain which could be contributing to headaches.  Plan: -Continue Nurtec 75 mg PRN for migraines -Limit use of pain relievers to no more than 2 days out of week to prevent risk of rebound or medication-overuse headache. -May consider PT for neck if neck pain is worsening.  Return to clinic in 1 year or sooner if needed  Total time spent reviewing records, interview, history/exam, documentation, and coordination of care on day of encounter:  15 min  Kai Levins, MD

## 2022-04-16 ENCOUNTER — Ambulatory Visit (INDEPENDENT_AMBULATORY_CARE_PROVIDER_SITE_OTHER): Payer: No Typology Code available for payment source | Admitting: Neurology

## 2022-04-16 ENCOUNTER — Encounter: Payer: Self-pay | Admitting: Neurology

## 2022-04-16 VITALS — BP 135/78 | HR 65 | Ht 72.0 in | Wt 239.0 lb

## 2022-04-16 DIAGNOSIS — R110 Nausea: Secondary | ICD-10-CM | POA: Diagnosis not present

## 2022-04-16 DIAGNOSIS — G43009 Migraine without aura, not intractable, without status migrainosus: Secondary | ICD-10-CM | POA: Diagnosis not present

## 2022-04-16 DIAGNOSIS — F40298 Other specified phobia: Secondary | ICD-10-CM | POA: Diagnosis not present

## 2022-04-16 DIAGNOSIS — H53149 Visual discomfort, unspecified: Secondary | ICD-10-CM | POA: Diagnosis not present

## 2022-04-16 DIAGNOSIS — M542 Cervicalgia: Secondary | ICD-10-CM

## 2022-07-03 ENCOUNTER — Encounter: Payer: Self-pay | Admitting: Surgical

## 2022-07-18 ENCOUNTER — Encounter: Payer: Self-pay | Admitting: Surgical

## 2023-04-09 NOTE — Progress Notes (Signed)
 NEUROLOGY FOLLOW UP OFFICE NOTE  Kyle [name] [id number]  Subjective:  Kyle [name] is a 63 y.o. year old left-handed male with a medical history of HTN, HLD, CAD, SVT, pre-diabetes, bursitis of bilateral hips, allergic rhinitis, BPH, and former smoker who we last saw on 04/16/22 for migraine.  To briefly review: 12/13/21 initial visit: Onset:  Patient first start having severe headaches about 6-7 years ago. He had more mild headaches in the past, but more related to neck pain, sinuses, and tension. Location:  Whole head usually Quality:  Pressure, stabbing  Intensity:  10/10.  It takes 5-10 minutes to reach maximum intensity. He denies new headache, thunderclap headache or severe headache that wakes from sleep. Aura:  No Prodrome:  No Postdrome:  Typically has residual pain on the left forehead area that fades out over a day Associated symptoms:  Nausea, photophobia, phonophobia.  He denies associated unilateral numbness or weakness. Duration:  6-7 hours Frequency:  1-2 per month Frequency of abortive medication: 3-4 per months Triggers:  sensitive to odors/perfumes Relieving factors:  laying in cold dark quiet room Activity:  does not want to be active   Past NSAIDS/analgesics:  Tylenol (didn't help) Past abortive triptans:  Imitrex (no help) - never prescribed to patient Past abortive ergotamine:  took a Nurtec from sister in law that immediately aborted HA Past muscle relaxants:  None Past anti-emetic:  None Past antihypertensive medications:  None Past antidepressant medications:  None Past anticonvulsant medications:  Topamax (took as PRN) - never prescribed to patient Past anti-CGRP:  None Past vitamins/Herbal/Supplements:  None Past antihistamines/decongestants:  None Other past therapies:  None   Current NSAIDS/analgesics:  Tylenol Current triptans:  None Current ergotamine:  None Current anti-emetic:  None Current muscle relaxants:  None Current  Antihypertensive medications:  None Current Antidepressant medications:  None Current Anticonvulsant medications:  None Current anti-CGRP:  None Current Vitamins/Herbal/Supplements:  None Current Antihistamines/Decongestants:  None Other therapy:  None Other medications:  None   Caffeine:  < 1 cup per day Alcohol:  None Smoker:  None Diet:  Very little red meat, more chicken and fish. Does not eat deli meats or salty foods. Likes chocolates Exercise:  stays active, 5 days a week at [employer] Depression:  No; Anxiety:  No Other pain:  occasional neck pain, low back pain, hip pain (bursitis) Sleep hygiene:  ~6 hours per night Family history of headache:  son, sister, father  I prescribed Nurtec for patient on 12/13/21.  04/16/22: B12 was 348. Patient has been taking Nurtec 1-2 times per month. He will take it at onset and it will relieve his headache within 1 hour. He is very pleased with his current symptoms.   He has no new complaints. He did have a fall from a ladder in 11/2021. He had xrays and only bruised his ribs. This has improved.  Most recent Assessment and Plan (04/16/22): This is [name], a 63 y.o. male with episodic migraine without aura. He is currently doing well on Nurtec PRN with 1-2 migraines per month. He also has neck pain which could be contributing to headaches.   Plan: -Continue Nurtec 75 mg PRN for migraines -Limit use of pain relievers to no more than 2 days out of week to prevent risk of rebound or medication-overuse headache. -May consider PT for neck if neck pain is worsening.  Since their last visit: Patient's headaches are about the same as previous. He has about 1-3 migraines per  month. He will take Nurtec and it will completely resolve his headaches quickly. He has sinus headaches, particularly with weather changes. Nurtec does not affect these. He takes Mucinex and nasal spray, which helps.  Patient has a nerve stimulator in his low  back, placed about a week ago for low back pain. He has had back pain for a long time with lumbar spondylosis. He has had burning in his feet for several years as well. He has pre-diabetes per HbA1c in 2023. He does drink EtOH. He denies any restrictive diet. His dad and mom both have burning in their feet. Patient has never had an EMG. He has tried gabapentin in the past and could not tolerate.   MEDICATIONS:  Outpatient Encounter Medications as of 04/17/2023  Medication Sig   aspirin EC 81 MG tablet Take 81 mg by mouth daily.     atorvastatin (LIPITOR) 40 MG tablet Take 1 tablet (40 mg total) by mouth every evening.   fluticasone (FLONASE) 50 MCG/ACT nasal spray Place 2 sprays into both nostrils daily as needed for allergies or rhinitis.   metoprolol succinate (TOPROL-XL) 25 MG 24 hr tablet Take 1 tablet (25 mg total) by mouth 2 (two) times daily. (Patient taking differently: Take 25 mg by mouth daily.)   NIACIN PO Take by mouth.   nitroGLYCERIN (NITROSTAT) 0.4 MG SL tablet Place 1 tablet (0.4 mg total) under the tongue every 5 (five) minutes as needed for chest pain.   Omega-3 Fatty Acids (SALMON OIL-1000) 200 MG CAPS Take 1 capsule by mouth daily.   Rimegepant Sulfate (NURTEC) 75 MG TBDP Take 75 mg by mouth daily as needed.   B Complex-C (B-COMPLEX WITH VITAMIN C) tablet Take 1 tablet by mouth daily. (Patient not taking: Reported on 12/13/2021)   finasteride (PROPECIA) 1 MG tablet Take 1 mg by mouth daily. (Patient not taking: Reported on 12/13/2021)   Ginger, Zingiber officinalis, (GINGER EXTRACT PO) Take 1 capsule by mouth 2 (two) times daily. (Patient not taking: Reported on 12/13/2021)   isosorbide mononitrate (IMDUR) 30 MG 24 hr tablet Take 1 tablet (30 mg total) by mouth daily. (Patient not taking: Reported on 04/17/2023)   VITAMIN A PO Take 1 capsule by mouth daily. (Patient not taking: Reported on 12/13/2021)   VITAMIN D, ERGOCALCIFEROL, PO Take 1 capsule by mouth daily. (Patient not  taking: Reported on 12/13/2021)   VITAMIN E PO Take 1 capsule by mouth daily. (Patient not taking: Reported on 12/13/2021)   No facility-administered encounter medications on file as of 04/17/2023.    PAST MEDICAL HISTORY: Past Medical History:  Diagnosis Date   Chronic back pain    Coronary artery disease    remote PCI to RCA, more recently s/p LAD PCI in 2017   Hyperlipidemia    Hypertension    Migraine    Pre-diabetes    PSVT (paroxysmal supraventricular tachycardia) (HCC)     PAST SURGICAL HISTORY: Past Surgical History:  Procedure Laterality Date   CARDIAC CATHETERIZATION  2008   mild nonobstructive CAD (40% proximal LAD stenosis). patent mid RCA stent   CARDIAC CATHETERIZATION N/A 09/14/2015   Procedure: Left Heart Cath and Coronary Angiography;  Surgeon: Marykay Lex, MD;  Location: [facility] INVASIVE [facility] LAB;  Service: Cardiovascular;  Laterality: N/A;   CARDIAC CATHETERIZATION N/A 09/14/2015   Procedure: Coronary Stent Intervention;  Surgeon: Marykay Lex, MD;  Location: [facility] INVASIVE [facility] LAB;  Service: Cardiovascular;  Laterality: N/A;   CORONARY PRESSURE/FFR STUDY N/A 02/17/2019  Procedure: INTRAVASCULAR PRESSURE WIRE/FFR STUDY;  Surgeon: Lyn Records, MD;  Location: [facility] INVASIVE CV LAB;  Service: Cardiovascular;  Laterality: N/A;   CORONARY STENT PLACEMENT     mid RCA: 3.5 X 16 mm Taxus DES   LEFT HEART CATH AND CORONARY ANGIOGRAPHY N/A 02/17/2019   Procedure: LEFT HEART CATH AND CORONARY ANGIOGRAPHY;  Surgeon: Lyn Records, MD;  Location: [facility] INVASIVE CV LAB;  Service: Cardiovascular;  Laterality: N/A;   LEG SURGERY     SUPRAVENTRICULAR TACHYCARDIA ABLATION N/A 07/02/2012   Procedure: SUPRAVENTRICULAR TACHYCARDIA ABLATION;  Surgeon: Marinus Maw, MD;  Location: [facility] CATH LAB;  Service: Cardiovascular;  Laterality: N/A;    ALLERGIES: No Known Allergies  FAMILY HISTORY: Family History  Problem Relation Age of Onset   CAD Father        PCI   Other Father         Carotid Artery Disease   Other Son        bicuspid aortic valve   Epilepsy Other    AAA (abdominal aortic aneurysm) Other     SOCIAL HISTORY: Social History   Tobacco Use   Smoking status: Former    Current packs/day: 0.00    Average packs/day: 0.8 packs/day for 15.0 years (11.2 ttl pk-yrs)    Types: Cigarettes    Start date: 10/11/1983    Quit date: 02/24/1998    Years since quitting: 25.1   Smokeless tobacco: Former    Types: Snuff    Quit date: 10/25/2013   Tobacco comments:    dip occasional on weekend  Vaping Use   Vaping status: Never Used  Substance Use Topics   Alcohol use: No    Alcohol/week: 0.0 standard drinks of alcohol   Drug use: No   Social History   Social History Narrative   Left Hand    Home is two story   Caffeine occa   Retired      Objective:  Vital Signs:  BP 118/75   Pulse 68   Ht 6' (1.829 m)   Wt 217 lb (98.4 kg)   SpO2 97%   BMI 29.43 kg/m   General: General appearance: Awake and alert. No distress. Cooperative with exam.  Skin: No obvious rash or jaundice. HEENT: Atraumatic. Anicteric. Lungs: Non-labored breathing on room air  Extremities: No edema.  Psych: Affect appropriate.  Neurological: Mental Status: Alert. Speech fluent. No pseudobulbar affect Cranial Nerves: CNII: No RAPD. Visual fields intact. CNIII, IV, VI: PERRL. No nystagmus. EOMI. CN V: Facial sensation intact bilaterally to fine touch. CN VII: Facial muscles symmetric and strong. No ptosis at rest. CN VIII: Hears finger rub well bilaterally. CN IX: No hypophonia. CN X: Palate elevates symmetrically. CN XI: Full strength shoulder shrug bilaterally. CN XII: Tongue protrusion full and midline. No atrophy or fasciculations. No significant dysarthria Motor: Tone is normal.  Individual muscle group testing (MRC grade out of 5):  Movement     Neck flexion 5    Neck extension 5     Right Left   Shoulder abduction 5 5   Shoulder adduction 5 5   Shoulder ext  rotation 5 5   Shoulder int rotation 5 5   Elbow flexion 5 5   Elbow extension 5 5   Wrist extension 5 5   Wrist flexion 5 5   Finger abduction - FDI 5 5   Finger abduction - ADM 5 5   Finger extension 5 5   Finger distal flexion -  2/3 5 5    Finger distal flexion - 4/5 5 5    Thumb flexion - FPL 5 5   Thumb abduction - APB 5 5    Hip flexion 5 5   Hip extension 5 5   Hip adduction 5 5   Hip abduction 5 5   Knee extension 5 5   Knee flexion 5 5   Dorsiflexion 5 5   Plantarflexion 5 5     Reflexes:  Right Left   Bicep 2+ 2+   Tricep 2+ 2+   BrRad 2+ 2+   Knee 2+ 2+   Ankle 2+ 2+    Pathological Reflexes: Babinski: flexor response bilaterally Hoffman: absent bilaterally Troemner: absent bilaterally Sensation: Pinprick: Intact in bilateral upper and lower extremities Vibration: Diminished in left great toe and ankle, otherwise intact Coordination: Intact finger-to- nose-finger bilaterally. Romberg with mild sway. Gait: Able to rise from chair with arms crossed unassisted. Narrow based. Antalgic gait.   Labs and Imaging review: No new results  Previously reviewed results: B12 (12/13/21): 348        Lab Results  Component Value Date    HGBA1C 5.5 02/17/2019      Recent Labs           Lab Results  Component Value Date    TSH 1.906 02/24/2011      External labs: Normal or unremarkable: CBC, CMP, TSH, Mg HbA1c (10/2021): 5.8  Assessment/Plan:  This is [name], a 63 y.o. male with: Episodic migraine without aura - having 1-3 migraines per month that respond quickly to Nurtec Low back pain and burning in feet - ddx includes lumbosacral radiculopathy vs neuropathy. The asymmetry of vibration sense suggests radiculopathy. Patient has recent gotten a spinal cord stimulator to help with this pain. We discussed EMG and other treatment options (failed gabapentin in the past), but agreed to allow time to respond to stimulator.   Plan: -Blood work: B12,  HbA1c, IFE -May consider EMG or other neuropathic pain medication if spinal cord stimulator is not helping  -For migraines: Migraine prevention:  Not currently indicated Migraine rescue:  Continue Nurtec 75 mg as needed Limit use of pain relievers to no more than 2 days out of week to prevent risk of rebound or medication-overuse headache. Keep headache diary   Return to clinic to be determined  Total time spent reviewing records, interview, history/exam, documentation, and coordination of care on day of encounter:  30 min  Jacquelyne Balint, MD

## 2023-04-17 ENCOUNTER — Ambulatory Visit (INDEPENDENT_AMBULATORY_CARE_PROVIDER_SITE_OTHER): Payer: Self-pay | Admitting: Neurology

## 2023-04-17 ENCOUNTER — Encounter: Payer: Self-pay | Admitting: Neurology

## 2023-04-17 ENCOUNTER — Other Ambulatory Visit: Payer: Non-veteran care

## 2023-04-17 VITALS — BP 120/78 | HR 68 | Ht 72.0 in | Wt 217.0 lb

## 2023-04-17 DIAGNOSIS — R208 Other disturbances of skin sensation: Secondary | ICD-10-CM

## 2023-04-17 DIAGNOSIS — G43009 Migraine without aura, not intractable, without status migrainosus: Secondary | ICD-10-CM

## 2023-04-17 DIAGNOSIS — H53149 Visual discomfort, unspecified: Secondary | ICD-10-CM

## 2023-04-17 DIAGNOSIS — M545 Low back pain, unspecified: Secondary | ICD-10-CM

## 2023-04-17 DIAGNOSIS — M542 Cervicalgia: Secondary | ICD-10-CM

## 2023-04-17 DIAGNOSIS — G8929 Other chronic pain: Secondary | ICD-10-CM

## 2023-04-17 DIAGNOSIS — R110 Nausea: Secondary | ICD-10-CM

## 2023-04-17 DIAGNOSIS — R7303 Prediabetes: Secondary | ICD-10-CM

## 2023-04-17 DIAGNOSIS — F40298 Other specified phobia: Secondary | ICD-10-CM

## 2023-04-17 MED ORDER — NURTEC 75 MG PO TBDP: 75.0000 mg | ORAL_TABLET | Freq: Every day | ORAL | 11 refills | Status: AC | PRN

## 2023-04-17 NOTE — Patient Instructions (Signed)
 I saw you today for migraines and burning in your feet.  For the burning in your feet, I will get lab work to make sure there are no treatable causes we haven't identified. I will be in touch when I have your results.  For your migraines, I refilled your Nurtec as this is working well for you. Let me know if your headaches worsen.  Follow up with me as needed.  The physicians and staff at [facility] are committed to providing excellent care. You may receive a survey requesting feedback about your experience at our office. We strive to receive "very good" responses to the survey questions. If you feel that your experience would prevent you from giving the office a "very good " response, please contact our office to try to remedy the situation. We may be reached at [phone]. Thank you for taking the time out of your busy day to complete the survey.  Jacquelyne Balint, MD [facility]

## 2023-04-21 LAB — IMMUNOFIXATION ELECTROPHORESIS
IgG (Immunoglobin G), Serum: 1178 mg/dL (ref 600–1540)
IgM, Serum: 53 mg/dL (ref 50–300)
Immunoglobulin A: 374 mg/dL — ABNORMAL HIGH (ref 70–320)

## 2023-04-21 LAB — VITAMIN B1: Vitamin B1 (Thiamine): <6 nmol/L — ABNORMAL LOW (ref 8–30)

## 2023-04-21 LAB — HEMOGLOBIN A1C
Hgb A1c MFr Bld: 5.7 %{Hb} — ABNORMAL HIGH (ref <5.7)
Mean Plasma Glucose: 117 mg/dL
eAG (mmol/L): 6.5 mmol/L

## 2023-04-23 ENCOUNTER — Telehealth: Payer: Self-pay

## 2023-04-23 NOTE — Telephone Encounter (Signed)
 Pt cld LMOM asking for a callback from [facility] with no details on message

## 2023-04-23 NOTE — Telephone Encounter (Signed)
 Called and asked to call office about pharmacy and lab results.

## 2023-04-23 NOTE — Telephone Encounter (Signed)
 Left message on machine for patient to call back.

## 2023-05-04 ENCOUNTER — Telehealth: Payer: Self-pay

## 2023-05-04 NOTE — Telephone Encounter (Signed)
 Called [facility] and they reported that they have the request and will hear from them in a couple of days. Call pt and let him now. He understood.

## 2023-08-13 ENCOUNTER — Telehealth: Payer: Self-pay

## 2023-08-13 NOTE — Telephone Encounter (Signed)
 Kyle [name] with [facility] calling to ask for fax number as she will be sending medical clearance request.  She would also like to confirm date of stent placement. Advised cardiac cath with stent placement occurred on 02/12/2006.  Office fax number provided.  Will forward to Preop to make them aware of clearance request. Belvia Boyer RN for assistance.

## 2023-08-13 NOTE — Telephone Encounter (Signed)
 Preop team has received clearance request will input clearance on our format once we get office to call back tried calling requesting office for clarification to what is patient getting done no answer left a detailed vm to call back and ask to speak to preop team

## 2023-08-14 NOTE — Telephone Encounter (Signed)
 Kyle [name] with [facility] calling to verify that the pt would only be getting a deep clean and X-Ray's.

## 2023-08-14 NOTE — Telephone Encounter (Signed)
 I called the [facility] office after reviewing the chart further. Pt has not followed our practice since 2016. I did state that it looks like the pt was also following [facility] cardiology, though that looks to be last 2021. I advised they call the pt back and ask who is he seeing now as a cardiologist. I did also state if the pt is wanting to come back to our practice that we will need a new pt referral sent to our office.   Lena at the [facility] office asked if the pt will need SBE. I stated that the cardiologist and preop APP will not notate on a pt that they have not seen since 2016.   In the meantime I will close this out.
